# Patient Record
Sex: Female | Born: 1970 | Hispanic: Yes | Marital: Married | Smoking: Never smoker
Health system: Southern US, Community
[De-identification: ages and names within clinical notes are randomized; demographics above are authoritative.]

## PROBLEM LIST (undated history)

## (undated) DIAGNOSIS — E079 Disorder of thyroid, unspecified: Secondary | ICD-10-CM

## (undated) DIAGNOSIS — D649 Anemia, unspecified: Secondary | ICD-10-CM

## (undated) DIAGNOSIS — E785 Hyperlipidemia, unspecified: Secondary | ICD-10-CM

## (undated) HISTORY — DX: Disorder of thyroid, unspecified: E07.9

## (undated) HISTORY — DX: Hyperlipidemia, unspecified: E78.5

## (undated) HISTORY — PX: THYROIDECTOMY: SHX17

## (undated) HISTORY — DX: Anemia, unspecified: D64.9

---

## 2007-06-13 ENCOUNTER — Ambulatory Visit: Payer: Self-pay | Admitting: Internal Medicine

## 2010-02-14 ENCOUNTER — Ambulatory Visit: Payer: Self-pay

## 2012-03-02 ENCOUNTER — Ambulatory Visit: Payer: Self-pay

## 2012-03-14 ENCOUNTER — Ambulatory Visit: Payer: Self-pay

## 2012-09-15 ENCOUNTER — Ambulatory Visit: Payer: Self-pay

## 2013-03-16 ENCOUNTER — Ambulatory Visit: Payer: Self-pay

## 2013-12-19 ENCOUNTER — Ambulatory Visit: Payer: Self-pay | Admitting: Otolaryngology

## 2013-12-26 ENCOUNTER — Ambulatory Visit: Payer: Self-pay | Admitting: Otolaryngology

## 2014-01-10 ENCOUNTER — Ambulatory Visit: Payer: Self-pay

## 2014-02-26 ENCOUNTER — Ambulatory Visit: Payer: Self-pay | Admitting: Internal Medicine

## 2014-08-28 ENCOUNTER — Ambulatory Visit: Payer: Self-pay | Admitting: Internal Medicine

## 2015-05-06 ENCOUNTER — Encounter: Payer: Self-pay | Admitting: Oncology

## 2015-05-06 ENCOUNTER — Encounter: Payer: Self-pay | Admitting: *Deleted

## 2015-05-06 ENCOUNTER — Ambulatory Visit: Payer: Self-pay | Attending: Oncology | Admitting: *Deleted

## 2015-05-06 ENCOUNTER — Inpatient Hospital Stay: Payer: Self-pay

## 2015-05-06 ENCOUNTER — Inpatient Hospital Stay: Payer: Self-pay | Attending: Oncology | Admitting: Oncology

## 2015-05-06 ENCOUNTER — Ambulatory Visit
Admission: RE | Admit: 2015-05-06 | Discharge: 2015-05-06 | Disposition: A | Discharge status: Home / Self Care | Payer: Self-pay | Source: Ambulatory Visit | Attending: Oncology | Admitting: Oncology

## 2015-05-06 VITALS — BP 120/77 | HR 51 | Temp 95.6°F | Ht 64.0 in | Wt 145.0 lb

## 2015-05-06 DIAGNOSIS — Z0000 Encounter for general adult medical examination without abnormal findings: Secondary | ICD-10-CM

## 2015-05-06 DIAGNOSIS — Z85850 Personal history of malignant neoplasm of thyroid: Secondary | ICD-10-CM | POA: Insufficient documentation

## 2015-05-06 DIAGNOSIS — C73 Malignant neoplasm of thyroid gland: Secondary | ICD-10-CM

## 2015-05-06 DIAGNOSIS — D509 Iron deficiency anemia, unspecified: Secondary | ICD-10-CM

## 2015-05-06 DIAGNOSIS — E785 Hyperlipidemia, unspecified: Secondary | ICD-10-CM | POA: Insufficient documentation

## 2015-05-06 DIAGNOSIS — Z79899 Other long term (current) drug therapy: Secondary | ICD-10-CM | POA: Insufficient documentation

## 2015-05-06 LAB — CBC
HEMATOCRIT: 34.8 % — AB (ref 35.0–47.0)
HEMOGLOBIN: 11.1 g/dL — AB (ref 12.0–16.0)
MCH: 24.3 pg — ABNORMAL LOW (ref 26.0–34.0)
MCHC: 32 g/dL (ref 32.0–36.0)
MCV: 75.9 fL — ABNORMAL LOW (ref 80.0–100.0)
Platelets: 361 10*3/uL (ref 150–440)
RBC: 4.58 MIL/uL (ref 3.80–5.20)
RDW: 19.1 % — ABNORMAL HIGH (ref 11.5–14.5)
WBC: 8.1 10*3/uL (ref 3.6–11.0)

## 2015-05-06 LAB — LACTATE DEHYDROGENASE: LDH: 151 U/L (ref 98–192)

## 2015-05-06 LAB — FERRITIN: Ferritin: 8 ng/mL — ABNORMAL LOW (ref 11–307)

## 2015-05-06 LAB — IRON AND TIBC
IRON: 49 ug/dL (ref 28–170)
Saturation Ratios: 9 % — ABNORMAL LOW (ref 10.4–31.8)
TIBC: 541 ug/dL — AB (ref 250–450)
UIBC: 492 ug/dL

## 2015-05-06 LAB — DAT, POLYSPECIFIC AHG (ARMC ONLY): POLYSPECIFIC AHG TEST: NEGATIVE

## 2015-05-06 LAB — FOLATE: Folate: 17.2 ng/mL (ref >5.9)

## 2015-05-06 LAB — VITAMIN B12: Vitamin B-12: 461 pg/mL (ref 180–914)

## 2015-05-06 LAB — TSH: TSH: 0.681 u[IU]/mL (ref 0.350–4.500)

## 2015-05-06 NOTE — Progress Notes (Signed)
Patient seen by Dr. Grayland Ormond in [facility] clinic as patient had screening appointment and mammogram scheduled for today.

## 2015-05-06 NOTE — Progress Notes (Signed)
Mailed letter to patient to inform her of her normal mammogram.  To follow-up with annual screening in one year.  HSIS to [place].

## 2015-05-06 NOTE — Progress Notes (Signed)
Subjective:     Patient ID: Teresa Atkins, female   DOB: [birth year], 44 y.o.   MRN: [id number]  HPI   Review of Systems     Objective:   Physical Exam  Pulmonary/Chest: Right breast exhibits no inverted nipple, no mass, no nipple discharge, no skin change and no tenderness. Left breast exhibits no inverted nipple, no mass, no nipple discharge, no skin change and no tenderness. Breasts are symmetrical.    Bilateral thickening in the upper outer quadrants       Assessment:     44 year old Hispanic female presents to [facility] for clinical breast exam and mammogram.  Kennyth Lose, the interpreter present during the interview and exam.  Clinical breast exam unremarkable.  Taught self breast awareness.  Patient has been screened for eligibility.  She does not have any insurance, Medicare or Medicaid.  She also meets financial eligibility.  Hand-out given on the Affordable Care Act.     Plan:    Screening mammogram ordered.  Will follow-up per protocol.

## 2015-05-07 LAB — HEMOGLOBINOPATHY EVALUATION
HGB A: 97.9 % (ref 94.0–98.0)
HGB F QUANT: 0 % (ref 0.0–2.0)
HGB S QUANTITAION: 0 %
Hgb A2 Quant: 2.1 % (ref 0.7–3.1)
Hgb C: 0 %

## 2015-05-07 LAB — THYROGLOBULIN ANTIBODY: Thyroglobulin Antibody: <1 IU/mL (ref 0.0–0.9)

## 2015-05-07 LAB — ERYTHROPOIETIN: ERYTHROPOIETIN: 28.2 m[IU]/mL — AB (ref 2.6–18.5)

## 2015-05-07 LAB — HAPTOGLOBIN: Haptoglobin: 235 mg/dL — ABNORMAL HIGH (ref 34–200)

## 2015-05-07 LAB — ANA W/REFLEX: ANA: NEGATIVE

## 2015-05-07 LAB — T4: T4, Total: 10.2 ug/dL (ref 4.5–12.0)

## 2015-05-17 NOTE — Progress Notes (Signed)
[place]  Telephone:(336) [phone] Fax:(336) [phone]  ID: Lahoma Crocker OB: 08/17/1970  MR#: [id number]  QIO#:[id number]  Patient Care Team: [name], MD as PCP - General (Internal Medicine) [name], RN as Registered Nurse [name], RN as Registered Nurse  CHIEF COMPLAINT:  Chief Complaint  Patient presents with  . Anemia    INTERVAL HISTORY: Patient is a 44 year old female who was found to have a decreased hemoglobin on routine blood work. She also has a history of papillary thyroid carcinoma status post thyroidectomy. She currently feels well and is asymptomatic. She has no neurologic complaints. She denies any recent fevers. She denies any pain. She does not complain of weakness or fatigue. She denies any chest pain or shortness of breath. She denies any nausea, vomiting, constipation, or diarrhea. She has no urinary complaints. Patient feels at her baseline and offers no specific complaints today.  REVIEW OF SYSTEMS:   Review of Systems  Constitutional: Negative.   HENT: Negative.   Respiratory: Negative.   Cardiovascular: Negative.   Gastrointestinal: Negative.   Musculoskeletal: Negative.   Neurological: Negative.     As per HPI. Otherwise, a complete review of systems is negatve.  PAST MEDICAL HISTORY: Past Medical History  Diagnosis Date  . Thyroid disease   . Hyperlipidemia   . Anemia     PAST SURGICAL HISTORY: Past Surgical History  Procedure Laterality Date  . Thyroidectomy      FAMILY HISTORY No family history on file.     ADVANCED DIRECTIVES:    HEALTH MAINTENANCE: Social History  Substance Use Topics  . Smoking status: Not on file  . Smokeless tobacco: Not on file  . Alcohol Use: Not on file     Colonoscopy:  PAP:  Bone density:  Lipid panel:  Allergies  Allergen Reactions  . Penicillins Rash    Current Outpatient Prescriptions  Medication Sig Dispense Refill  . acetaminophen  (TYLENOL) 500 MG tablet Take 500 mg by mouth every 6 (six) hours as needed.    . ferrous sulfate 325 (65 FE) MG tablet Take 325 mg by mouth 2 (two) times daily with a meal.    . levothyroxine (SYNTHROID, LEVOTHROID) 112 MCG tablet Take 112 mcg by mouth daily before breakfast.    . sertraline (ZOLOFT) 50 MG tablet Take 50 mg by mouth at bedtime.     No current facility-administered medications for this visit.    OBJECTIVE: There were no vitals filed for this visit.   There is no height or weight on file to calculate BMI.    ECOG FS:0 - Asymptomatic  General: Well-developed, well-nourished, no acute distress. Eyes: Pink conjunctiva, anicteric sclera. HEENT: Well-healed surgical scar. No palpable masses or lymphadenopathy. Lungs: Clear to auscultation bilaterally. Heart: Regular rate and rhythm. No rubs, murmurs, or gallops. Abdomen: Soft, nontender, nondistended. No organomegaly noted, normoactive bowel sounds. Musculoskeletal: No edema, cyanosis, or clubbing. Neuro: Alert, answering all questions appropriately. Cranial nerves grossly intact. Skin: No rashes or petechiae noted. Psych: Normal affect.  LAB RESULTS:  No results found for: NA, K, CL, CO2, GLUCOSE, BUN, CREATININE, CALCIUM, PROT, ALBUMIN, AST, ALT, ALKPHOS, BILITOT, GFRNONAA, GFRAA  Lab Results  Component Value Date   WBC 8.1 05/06/2015   HGB 11.1* 05/06/2015   HCT 34.8* 05/06/2015   MCV 75.9* 05/06/2015   PLT 361 05/06/2015     STUDIES: Mm Digital Screening  05/06/2015   CLINICAL DATA:  Screening.  EXAM: DIGITAL SCREENING BILATERAL MAMMOGRAM WITH  CAD  COMPARISON:  Previous exam(s).  ACR Breast Density Category c: The breast tissue is heterogeneously dense, which may obscure small masses.  FINDINGS: There are no findings suspicious for malignancy. Images were processed with CAD.  IMPRESSION: No mammographic evidence of malignancy. A result letter of this screening mammogram will be mailed directly to the patient.   RECOMMENDATION: Screening mammogram in one year. (Code:SM-B-01Y)  BI-RADS CATEGORY  1: Negative.   Electronically Signed   By: Altamese Cabal M.D.   On: 05/06/2015 13:09    ASSESSMENT: Iron deficiency anemia, history of thyroid cancer.  PLAN:    1. Iron deficiency anemia: Patient's hemoglobin is only mildly decreased, but she has significantly decreased iron stores. This is likely related to her reportedly heavy menses. The remainder of her laboratory work is either negative or within normal limits. Return to clinic in 1 month for further evaluation and consideration of IV Feraheme. 2. History of thyroid cancer: Patient underwent thyroidectomy at [facility], but does not report having received radioactive iodide. Antithyroglobulin antibodies are negative. She has been instructed to keep her ENT follow-up as previously scheduled. Continue current dose of Synthroid. 3. BCCCP: Screening mammogram is BI-RADS 1 as reported above.  The entire visit was done in the presence of an interpreter.  Patient expressed understanding and was in agreement with this plan. She also understands that She can call clinic at any time with any questions, concerns, or complaints.   Lloyd Huger, MD   05/17/2015 7:14 PM

## 2015-06-05 ENCOUNTER — Other Ambulatory Visit: Payer: Self-pay | Admitting: *Deleted

## 2015-06-05 ENCOUNTER — Inpatient Hospital Stay: Payer: Self-pay

## 2015-06-05 ENCOUNTER — Inpatient Hospital Stay: Payer: Self-pay | Admitting: Oncology

## 2015-06-05 DIAGNOSIS — D649 Anemia, unspecified: Secondary | ICD-10-CM

## 2015-06-17 ENCOUNTER — Ambulatory Visit: Payer: Self-pay | Admitting: Oncology

## 2015-06-17 ENCOUNTER — Ambulatory Visit: Payer: Self-pay

## 2015-06-17 ENCOUNTER — Other Ambulatory Visit: Payer: Self-pay

## 2015-06-20 ENCOUNTER — Inpatient Hospital Stay: Payer: Self-pay | Admitting: Oncology

## 2015-06-20 ENCOUNTER — Inpatient Hospital Stay: Payer: Self-pay | Attending: Oncology

## 2015-06-20 ENCOUNTER — Inpatient Hospital Stay: Payer: Self-pay

## 2016-04-06 ENCOUNTER — Inpatient Hospital Stay: Payer: Self-pay

## 2016-04-06 ENCOUNTER — Inpatient Hospital Stay: Payer: Self-pay | Attending: Oncology | Admitting: Oncology

## 2016-04-06 ENCOUNTER — Encounter (INDEPENDENT_AMBULATORY_CARE_PROVIDER_SITE_OTHER): Payer: Self-pay

## 2016-04-06 VITALS — BP 115/74 | HR 75 | Temp 97.3°F | Resp 18 | Wt 155.2 lb

## 2016-04-06 DIAGNOSIS — D649 Anemia, unspecified: Secondary | ICD-10-CM

## 2016-04-06 DIAGNOSIS — R531 Weakness: Secondary | ICD-10-CM | POA: Insufficient documentation

## 2016-04-06 DIAGNOSIS — E890 Postprocedural hypothyroidism: Secondary | ICD-10-CM | POA: Insufficient documentation

## 2016-04-06 DIAGNOSIS — E785 Hyperlipidemia, unspecified: Secondary | ICD-10-CM | POA: Insufficient documentation

## 2016-04-06 DIAGNOSIS — R1310 Dysphagia, unspecified: Secondary | ICD-10-CM | POA: Insufficient documentation

## 2016-04-06 DIAGNOSIS — D509 Iron deficiency anemia, unspecified: Secondary | ICD-10-CM | POA: Insufficient documentation

## 2016-04-06 DIAGNOSIS — R5383 Other fatigue: Secondary | ICD-10-CM | POA: Insufficient documentation

## 2016-04-06 DIAGNOSIS — Z85850 Personal history of malignant neoplasm of thyroid: Secondary | ICD-10-CM | POA: Insufficient documentation

## 2016-04-06 DIAGNOSIS — E079 Disorder of thyroid, unspecified: Secondary | ICD-10-CM | POA: Insufficient documentation

## 2016-04-06 LAB — CBC WITH DIFFERENTIAL/PLATELET
BASOS ABS: 0.1 10*3/uL (ref 0–0.1)
Basophils Relative: 1 %
Eosinophils Absolute: 0.1 10*3/uL (ref 0–0.7)
Eosinophils Relative: 2 %
HCT: 31 % — ABNORMAL LOW (ref 35.0–47.0)
HEMOGLOBIN: 10.1 g/dL — AB (ref 12.0–16.0)
LYMPHS ABS: 2.3 10*3/uL (ref 1.0–3.6)
LYMPHS PCT: 29 %
MCH: 23.2 pg — AB (ref 26.0–34.0)
MCHC: 32.6 g/dL (ref 32.0–36.0)
MCV: 71.2 fL — AB (ref 80.0–100.0)
Monocytes Absolute: 0.6 10*3/uL (ref 0.2–0.9)
Monocytes Relative: 7 %
NEUTROS ABS: 4.7 10*3/uL (ref 1.4–6.5)
NEUTROS PCT: 61 %
PLATELETS: 413 10*3/uL (ref 150–440)
RBC: 4.35 MIL/uL (ref 3.80–5.20)
RDW: 18.5 % — ABNORMAL HIGH (ref 11.5–14.5)
WBC: 7.7 10*3/uL (ref 3.6–11.0)

## 2016-04-06 LAB — IRON AND TIBC
Iron: 10 ug/dL — ABNORMAL LOW (ref 28–170)
SATURATION RATIOS: 2 % — AB (ref 10.4–31.8)
TIBC: 469 ug/dL — AB (ref 250–450)
UIBC: 459 ug/dL

## 2016-04-06 LAB — FERRITIN: Ferritin: 4 ng/mL — ABNORMAL LOW (ref 11–307)

## 2016-04-06 NOTE — Progress Notes (Signed)
States is feeling fatigued with decreased energy levels. Referral back for anemia.

## 2016-04-06 NOTE — Progress Notes (Signed)
[place]  Telephone:(336) [phone] Fax:(336) [phone]  ID: Teresa Atkins OB: 1971-03-15  MR#: [id number]  [id number]  Patient Care Team: [name], MD as PCP - General (Internal Medicine) [name], RN as Registered Nurse [name], RN as Registered Nurse  CHIEF COMPLAINT: Iron deficiency anemia.  INTERVAL HISTORY: Patient last evaluated in clinic in October 2016. She is referred back for worsening hemoglobin and iron stores. She is also symptomatic with increased weakness and fatigue. She also noted some discomfort swallowing but no swelling or lymphadenopathy. She has no neurologic complaints. She denies any recent fevers. She denies any pain. She denies any chest pain or shortness of breath. She denies any nausea, vomiting, constipation, or diarrhea. She has no urinary complaints. Patient offers no further specific complaints today.  REVIEW OF SYSTEMS:   Review of Systems  Constitutional: Positive for malaise/fatigue. Negative for fever and weight loss.  HENT: Positive for sore throat.   Respiratory: Negative.  Negative for cough and shortness of breath.   Cardiovascular: Negative.  Negative for chest pain.  Gastrointestinal: Negative.  Negative for abdominal pain, blood in stool and melena.  Genitourinary: Negative.   Musculoskeletal: Negative.   Neurological: Positive for weakness.  Endo/Heme/Allergies: Does not bruise/bleed easily.  Psychiatric/Behavioral: Negative.  The patient is not nervous/anxious.     As per HPI. Otherwise, a complete review of systems is negative.  PAST MEDICAL HISTORY: Past Medical History:  Diagnosis Date  . Anemia   . Hyperlipidemia   . Thyroid disease     PAST SURGICAL HISTORY: Past Surgical History:  Procedure Laterality Date  . THYROIDECTOMY      FAMILY HISTORY: Reviewed and unchanged. No reported history of malignancy or chronic disease.     ADVANCED DIRECTIVES:    HEALTH  MAINTENANCE: Social History  Substance Use Topics  . Smoking status: Not on file  . Smokeless tobacco: Not on file  . Alcohol use Not on file     Colonoscopy:  PAP:  Bone density:  Lipid panel:  Allergies  Allergen Reactions  . Penicillins Rash    Current Outpatient Prescriptions  Medication Sig Dispense Refill  . levothyroxine (SYNTHROID, LEVOTHROID) 112 MCG tablet Take 112 mcg by mouth daily before breakfast.     No current facility-administered medications for this visit.     OBJECTIVE: Vitals:   04/06/16 1624  BP: 115/74  Pulse: 75  Resp: 18  Temp: 97.3 F (36.3 C)     Body mass index is 26.64 kg/m.    ECOG FS:0 - Asymptomatic  General: Well-developed, well-nourished, no acute distress. Eyes: Pink conjunctiva, anicteric sclera. HEENT: Well-healed surgical scar. No palpable masses or lymphadenopathy. Lungs: Clear to auscultation bilaterally. Heart: Regular rate and rhythm. No rubs, murmurs, or gallops. Abdomen: Soft, nontender, nondistended. No organomegaly noted, normoactive bowel sounds. Musculoskeletal: No edema, cyanosis, or clubbing. Neuro: Alert, answering all questions appropriately. Cranial nerves grossly intact. Skin: No rashes or petechiae noted. Psych: Normal affect.  LAB RESULTS:  No results found for: NA, K, CL, CO2, GLUCOSE, BUN, CREATININE, CALCIUM, PROT, ALBUMIN, AST, ALT, ALKPHOS, BILITOT, GFRNONAA, GFRAA  Lab Results  Component Value Date   WBC 7.7 04/06/2016   NEUTROABS 4.7 04/06/2016   HGB 10.1 (L) 04/06/2016   HCT 31.0 (L) 04/06/2016   MCV 71.2 (L) 04/06/2016   PLT 413 04/06/2016   Lab Results  Component Value Date   IRON 10 (L) 04/06/2016   TIBC 469 (H) 04/06/2016   IRONPCTSAT 2 (L)  04/06/2016   Lab Results  Component Value Date   FERRITIN 4 (L) 04/06/2016     STUDIES: No results found.  ASSESSMENT: Iron deficiency anemia, history of thyroid cancer.  PLAN:    1. Iron deficiency anemia: Patient's hemoglobin and  iron stores have trended down and she is symptomatic. This is likely related to her reportedly heavy menses. The remainder of her laboratory work is either negative or within normal limits. Return to clinic in 1 and 2 weeks to receive 510 mg of IV iron. Patient will then return to clinic in 3 months for repeat laboratory work and further evaluation.  2. History of thyroid cancer: Patient underwent thyroidectomy at [facility], but does not report having received radioactive iodide. Antithyroglobulin antibodies and repeat thyroid studies are pending at time of dictation. Given patient's complaint of problem swallowing, will get repeat ultrasound and neck for further evaluation. Continue current dose of Synthroid. 3. BCCCP: Screening mammogram is BI-RADS 1.  The entire visit was done in the presence of an interpreter.  Patient expressed understanding and was in agreement with this plan. She also understands that She can call clinic at any time with any questions, concerns, or complaints.   Lloyd Huger, MD   04/10/2016 5:07 PM

## 2016-04-10 ENCOUNTER — Other Ambulatory Visit: Payer: Self-pay | Admitting: Oncology

## 2016-04-14 ENCOUNTER — Inpatient Hospital Stay: Payer: Self-pay | Attending: Oncology

## 2016-04-14 DIAGNOSIS — D509 Iron deficiency anemia, unspecified: Secondary | ICD-10-CM | POA: Insufficient documentation

## 2016-04-14 DIAGNOSIS — Z79899 Other long term (current) drug therapy: Secondary | ICD-10-CM | POA: Insufficient documentation

## 2016-04-20 ENCOUNTER — Ambulatory Visit
Admission: RE | Admit: 2016-04-20 | Discharge: 2016-04-20 | Disposition: A | Discharge status: Home / Self Care | Payer: Self-pay | Source: Ambulatory Visit | Attending: Oncology | Admitting: Oncology

## 2016-04-20 DIAGNOSIS — Z9889 Other specified postprocedural states: Secondary | ICD-10-CM | POA: Insufficient documentation

## 2016-04-20 DIAGNOSIS — R1310 Dysphagia, unspecified: Secondary | ICD-10-CM | POA: Insufficient documentation

## 2016-04-21 ENCOUNTER — Inpatient Hospital Stay: Payer: Self-pay

## 2016-04-21 VITALS — BP 100/61 | HR 80 | Resp 20

## 2016-04-21 DIAGNOSIS — D509 Iron deficiency anemia, unspecified: Secondary | ICD-10-CM

## 2016-04-21 MED ORDER — SODIUM CHLORIDE 0.9 % IV SOLN
510.0000 mg | Freq: Once | INTRAVENOUS | Status: AC
  Administered 2016-04-21: 510 mg via INTRAVENOUS
  Filled 2016-04-21: qty 17

## 2016-04-21 MED ORDER — SODIUM CHLORIDE 0.9 % IV SOLN
Freq: Once | INTRAVENOUS | Status: AC
  Administered 2016-04-21: 14:00:00 via INTRAVENOUS
  Filled 2016-04-21: qty 1000

## 2016-04-28 ENCOUNTER — Inpatient Hospital Stay: Payer: Self-pay

## 2016-04-28 VITALS — BP 101/65 | HR 65 | Temp 97.6°F | Resp 18

## 2016-04-28 DIAGNOSIS — D509 Iron deficiency anemia, unspecified: Secondary | ICD-10-CM

## 2016-04-28 MED ORDER — FERUMOXYTOL INJECTION 510 MG/17 ML
510.0000 mg | Freq: Once | INTRAVENOUS | Status: AC
  Administered 2016-04-28: 510 mg via INTRAVENOUS
  Filled 2016-04-28: qty 17

## 2016-04-28 MED ORDER — SODIUM CHLORIDE 0.9 % IV SOLN
Freq: Once | INTRAVENOUS | Status: AC
  Administered 2016-04-28: 14:00:00 via INTRAVENOUS
  Filled 2016-04-28: qty 1000

## 2016-07-08 ENCOUNTER — Other Ambulatory Visit: Payer: Self-pay | Admitting: *Deleted

## 2016-07-08 DIAGNOSIS — D509 Iron deficiency anemia, unspecified: Secondary | ICD-10-CM

## 2016-07-08 NOTE — Progress Notes (Signed)
[place]  Telephone:(336) [phone] Fax:(336) [phone]  ID: Lahoma Crocker OB: 1971/02/09  MR#: [id number]  [id number]  Patient Care Team: [name], MD as PCP - General (Internal Medicine) [name], RN as Registered Nurse [name], RN as Registered Nurse  CHIEF COMPLAINT: Iron deficiency anemia.  INTERVAL HISTORY: Patient returns to clinic today for repeat laboratory work and further evaluation. She currently feels well and is asymptomatic. She does not complain of any further weakness or fatigue. She has no neurologic complaints. She denies any recent fevers. She denies any pain. She denies any chest pain or shortness of breath. She denies any nausea, vomiting, constipation, or diarrhea. She denies any melena or hematochezia.  She has no urinary complaints. Patient offers no specific complaints today.  REVIEW OF SYSTEMS:   Review of Systems  Constitutional: Negative for fever, malaise/fatigue and weight loss.  HENT: Negative for sore throat.   Respiratory: Negative.  Negative for cough and shortness of breath.   Cardiovascular: Negative.  Negative for chest pain and leg swelling.  Gastrointestinal: Negative.  Negative for abdominal pain, blood in stool and melena.  Genitourinary: Negative.   Musculoskeletal: Negative.   Neurological: Negative.  Negative for weakness.  Endo/Heme/Allergies: Does not bruise/bleed easily.  Psychiatric/Behavioral: Negative.  The patient is not nervous/anxious.     As per HPI. Otherwise, a complete review of systems is negative.  PAST MEDICAL HISTORY: Past Medical History:  Diagnosis Date  . Anemia   . Hyperlipidemia   . Thyroid disease     PAST SURGICAL HISTORY: Past Surgical History:  Procedure Laterality Date  . THYROIDECTOMY      FAMILY HISTORY: Reviewed and unchanged. No reported history of malignancy or chronic disease.     ADVANCED DIRECTIVES:    HEALTH MAINTENANCE: Social  History  Substance Use Topics  . Smoking status: Not on file  . Smokeless tobacco: Not on file  . Alcohol use Not on file     Colonoscopy:  PAP:  Bone density:  Lipid panel:  Allergies  Allergen Reactions  . Iron Other (See Comments)  . Penicillins Rash    Current Outpatient Prescriptions  Medication Sig Dispense Refill  . levothyroxine (SYNTHROID, LEVOTHROID) 112 MCG tablet Take 112 mcg by mouth daily before breakfast.     No current facility-administered medications for this visit.     OBJECTIVE: Vitals:   07/09/16 1424  BP: 111/72  Pulse: 70  Resp: 18  Temp: 99.1 F (37.3 C)     Body mass index is 27 kg/m.    ECOG FS:0 - Asymptomatic  General: Well-developed, well-nourished, no acute distress. Eyes: Pink conjunctiva, anicteric sclera. Lungs: Clear to auscultation bilaterally. Heart: Regular rate and rhythm. No rubs, murmurs, or gallops. Abdomen: Soft, nontender, nondistended. No organomegaly noted, normoactive bowel sounds. Musculoskeletal: No edema, cyanosis, or clubbing. Neuro: Alert, answering all questions appropriately. Cranial nerves grossly intact. Skin: No rashes or petechiae noted. Psych: Normal affect.  LAB RESULTS:  No results found for: NA, K, CL, CO2, GLUCOSE, BUN, CREATININE, CALCIUM, PROT, ALBUMIN, AST, ALT, ALKPHOS, BILITOT, GFRNONAA, GFRAA  Lab Results  Component Value Date   WBC 7.9 07/09/2016   NEUTROABS 5.0 07/09/2016   HGB 12.7 07/09/2016   HCT 36.8 07/09/2016   MCV 85.3 07/09/2016   PLT 334 07/09/2016   Lab Results  Component Value Date   IRON 71 07/09/2016   TIBC 350 07/09/2016   IRONPCTSAT 20 07/09/2016   Lab Results  Component Value Date  FERRITIN 30 07/09/2016     STUDIES: No results found.  ASSESSMENT: Iron deficiency anemia, history of thyroid cancer.  PLAN:    1. Iron deficiency anemia: Patient's hemoglobin and iron stores Are now within normal limits. Previously, the remainder of her laboratory work is  either negative or within normal limits. Patient last received IV Feraheme in September 2017. No intervention is needed at this time. Return to clinic in 4 months with repeat laboratory work and further evaluation.  2. History of thyroid cancer: Patient underwent thyroidectomy at [facility], but does not report having received radioactive iodide. Antithyroglobulin antibodies for May 06, 2015 are negative. Ultrasound of soft tissue neck on April 20, 2016 did not reveal any evidence of recurrence. Continue current dose of Synthroid. 3. BCCCP: Screening mammogram is BI-RADS 1.  The entire visit was done in the presence of an interpreter.  Patient expressed understanding and was in agreement with this plan. She also understands that She can call clinic at any time with any questions, concerns, or complaints.   Lloyd Huger, MD   07/13/2016 11:25 AM

## 2016-07-09 ENCOUNTER — Inpatient Hospital Stay: Payer: Self-pay

## 2016-07-09 ENCOUNTER — Inpatient Hospital Stay: Payer: Self-pay | Attending: Oncology

## 2016-07-09 ENCOUNTER — Inpatient Hospital Stay (HOSPITAL_BASED_OUTPATIENT_CLINIC_OR_DEPARTMENT_OTHER): Payer: Self-pay | Admitting: Oncology

## 2016-07-09 VITALS — BP 111/72 | HR 70 | Temp 99.1°F | Resp 18 | Wt 157.3 lb

## 2016-07-09 DIAGNOSIS — D509 Iron deficiency anemia, unspecified: Secondary | ICD-10-CM

## 2016-07-09 DIAGNOSIS — Z85850 Personal history of malignant neoplasm of thyroid: Secondary | ICD-10-CM | POA: Insufficient documentation

## 2016-07-09 DIAGNOSIS — E785 Hyperlipidemia, unspecified: Secondary | ICD-10-CM | POA: Insufficient documentation

## 2016-07-09 DIAGNOSIS — E890 Postprocedural hypothyroidism: Secondary | ICD-10-CM

## 2016-07-09 DIAGNOSIS — Z79899 Other long term (current) drug therapy: Secondary | ICD-10-CM

## 2016-07-09 LAB — IRON AND TIBC
IRON: 71 ug/dL (ref 28–170)
Saturation Ratios: 20 % (ref 10.4–31.8)
TIBC: 350 ug/dL (ref 250–450)
UIBC: 279 ug/dL

## 2016-07-09 LAB — CBC WITH DIFFERENTIAL/PLATELET
BASOS ABS: 0.1 10*3/uL (ref 0–0.1)
BASOS PCT: 1 %
EOS ABS: 0.1 10*3/uL (ref 0–0.7)
Eosinophils Relative: 2 %
HEMATOCRIT: 36.8 % (ref 35.0–47.0)
Hemoglobin: 12.7 g/dL (ref 12.0–16.0)
Lymphocytes Relative: 29 %
Lymphs Abs: 2.3 10*3/uL (ref 1.0–3.6)
MCH: 29.4 pg (ref 26.0–34.0)
MCHC: 34.4 g/dL (ref 32.0–36.0)
MCV: 85.3 fL (ref 80.0–100.0)
MONO ABS: 0.5 10*3/uL (ref 0.2–0.9)
Monocytes Relative: 6 %
NEUTROS ABS: 5 10*3/uL (ref 1.4–6.5)
Neutrophils Relative %: 62 %
PLATELETS: 334 10*3/uL (ref 150–440)
RBC: 4.31 MIL/uL (ref 3.80–5.20)
RDW: 19.8 % — AB (ref 11.5–14.5)
WBC: 7.9 10*3/uL (ref 3.6–11.0)

## 2016-07-09 LAB — FERRITIN: FERRITIN: 30 ng/mL (ref 11–307)

## 2016-07-09 NOTE — Progress Notes (Signed)
Offers no complaints  

## 2016-11-03 NOTE — Progress Notes (Deleted)
[place]  Telephone:(336) [phone] Fax:(336) [phone]  ID: Teresa Atkins OB: January 17, 1971  MR#: [id number]  KNL#:[id number]  Patient Care Team: [name], MD as PCP - General (Internal Medicine) [name], RN as Registered Nurse [name], RN as Registered Nurse  CHIEF COMPLAINT: Iron deficiency anemia.  INTERVAL HISTORY: Patient returns to clinic today for repeat laboratory work and further evaluation. She currently feels well and is asymptomatic. She does not complain of any further weakness or fatigue. She has no neurologic complaints. She denies any recent fevers. She denies any pain. She denies any chest pain or shortness of breath. She denies any nausea, vomiting, constipation, or diarrhea. She denies any melena or hematochezia.  She has no urinary complaints. Patient offers no specific complaints today.  REVIEW OF SYSTEMS:   Review of Systems  Constitutional: Negative for fever, malaise/fatigue and weight loss.  HENT: Negative for sore throat.   Respiratory: Negative.  Negative for cough and shortness of breath.   Cardiovascular: Negative.  Negative for chest pain and leg swelling.  Gastrointestinal: Negative.  Negative for abdominal pain, blood in stool and melena.  Genitourinary: Negative.   Musculoskeletal: Negative.   Neurological: Negative.  Negative for weakness.  Endo/Heme/Allergies: Does not bruise/bleed easily.  Psychiatric/Behavioral: Negative.  The patient is not nervous/anxious.     As per HPI. Otherwise, a complete review of systems is negative.  PAST MEDICAL HISTORY: Past Medical History:  Diagnosis Date  . Anemia   . Hyperlipidemia   . Thyroid disease     PAST SURGICAL HISTORY: Past Surgical History:  Procedure Laterality Date  . THYROIDECTOMY      FAMILY HISTORY: Reviewed and unchanged. No reported history of malignancy or chronic disease.     ADVANCED DIRECTIVES:    HEALTH MAINTENANCE: Social  History  Substance Use Topics  . Smoking status: Not on file  . Smokeless tobacco: Not on file  . Alcohol use Not on file     Colonoscopy:  PAP:  Bone density:  Lipid panel:  Allergies  Allergen Reactions  . Iron Other (See Comments)  . Penicillins Rash    Current Outpatient Prescriptions  Medication Sig Dispense Refill  . levothyroxine (SYNTHROID, LEVOTHROID) 112 MCG tablet Take 112 mcg by mouth daily before breakfast.     No current facility-administered medications for this visit.     OBJECTIVE: There were no vitals filed for this visit.   There is no height or weight on file to calculate BMI.    ECOG FS:0 - Asymptomatic  General: Well-developed, well-nourished, no acute distress. Eyes: Pink conjunctiva, anicteric sclera. Lungs: Clear to auscultation bilaterally. Heart: Regular rate and rhythm. No rubs, murmurs, or gallops. Abdomen: Soft, nontender, nondistended. No organomegaly noted, normoactive bowel sounds. Musculoskeletal: No edema, cyanosis, or clubbing. Neuro: Alert, answering all questions appropriately. Cranial nerves grossly intact. Skin: No rashes or petechiae noted. Psych: Normal affect.  LAB RESULTS:  No results found for: NA, K, CL, CO2, GLUCOSE, BUN, CREATININE, CALCIUM, PROT, ALBUMIN, AST, ALT, ALKPHOS, BILITOT, GFRNONAA, GFRAA  Lab Results  Component Value Date   WBC 7.9 07/09/2016   NEUTROABS 5.0 07/09/2016   HGB 12.7 07/09/2016   HCT 36.8 07/09/2016   MCV 85.3 07/09/2016   PLT 334 07/09/2016   Lab Results  Component Value Date   IRON 71 07/09/2016   TIBC 350 07/09/2016   IRONPCTSAT 20 07/09/2016   Lab Results  Component Value Date   FERRITIN 30 07/09/2016  STUDIES: No results found.  ASSESSMENT: Iron deficiency anemia, history of thyroid cancer.  PLAN:    1. Iron deficiency anemia: Patient's hemoglobin and iron stores Are now within normal limits. Previously, the remainder of her laboratory work is either negative or  within normal limits. Patient last received IV Feraheme in September 2017. No intervention is needed at this time. Return to clinic in 4 months with repeat laboratory work and further evaluation.  2. History of thyroid cancer: Patient underwent thyroidectomy at [facility], but does not report having received radioactive iodide. Antithyroglobulin antibodies for May 06, 2015 are negative. Ultrasound of soft tissue neck on April 20, 2016 did not reveal any evidence of recurrence. Continue current dose of Synthroid. 3. BCCCP: Screening mammogram is BI-RADS 1.  The entire visit was done in the presence of an interpreter.  Patient expressed understanding and was in agreement with this plan. She also understands that She can call clinic at any time with any questions, concerns, or complaints.   Lloyd Huger, MD   11/03/2016 11:22 PM

## 2016-11-04 ENCOUNTER — Other Ambulatory Visit: Payer: Self-pay | Admitting: Oncology

## 2016-11-05 ENCOUNTER — Inpatient Hospital Stay: Payer: Self-pay | Attending: Oncology

## 2016-11-05 ENCOUNTER — Inpatient Hospital Stay: Payer: Self-pay | Admitting: Oncology

## 2016-11-05 ENCOUNTER — Inpatient Hospital Stay: Payer: Self-pay

## 2017-03-17 ENCOUNTER — Other Ambulatory Visit: Payer: Self-pay | Admitting: Internal Medicine

## 2017-03-25 IMAGING — US US SOFT TISSUE HEAD/NECK
1 series · 14 of 25 positions shown · non-contrast
Comparison: Prior thyroid ultrasound 12/19/2013 ;

CLINICAL DATA: 45-year-old female with a history of thyroid cancer
status post thyroidectomy in 6900. New onset dysphagia.

EXAM:
THYROID ULTRASOUND
TECHNIQUE: Ultrasound examination of the thyroid gland and adjacent soft
tissues was performed.

[Series 1: us soft tissue head/neck · 0.06mm/px · 14 of 28 slices shown]
[im 1/28]
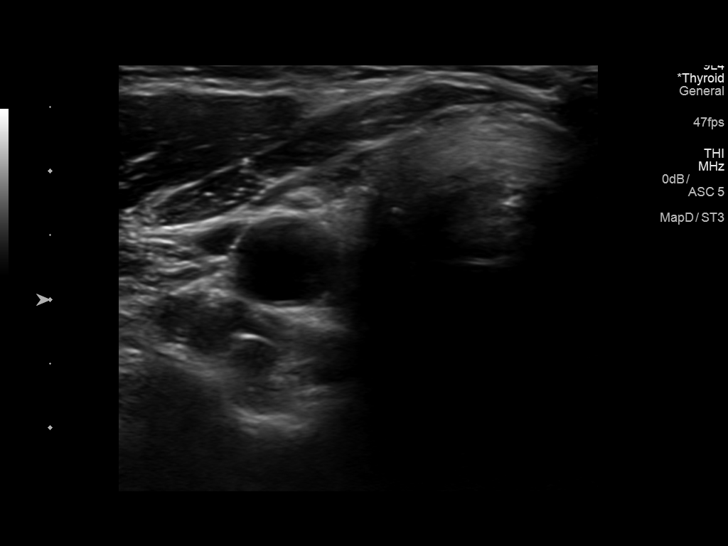
[im 3/28]
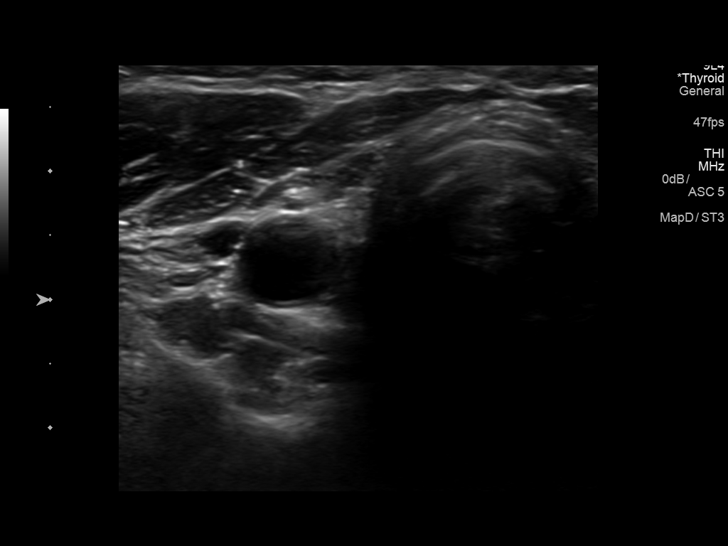
[im 5/28]
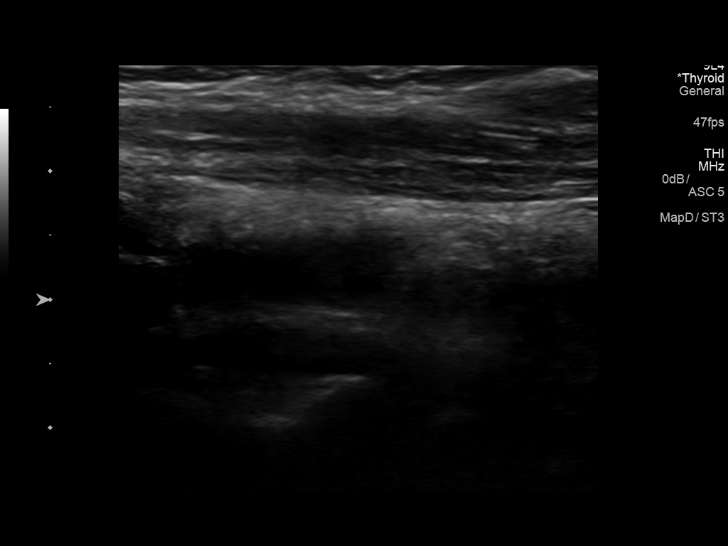
[im 7/28]
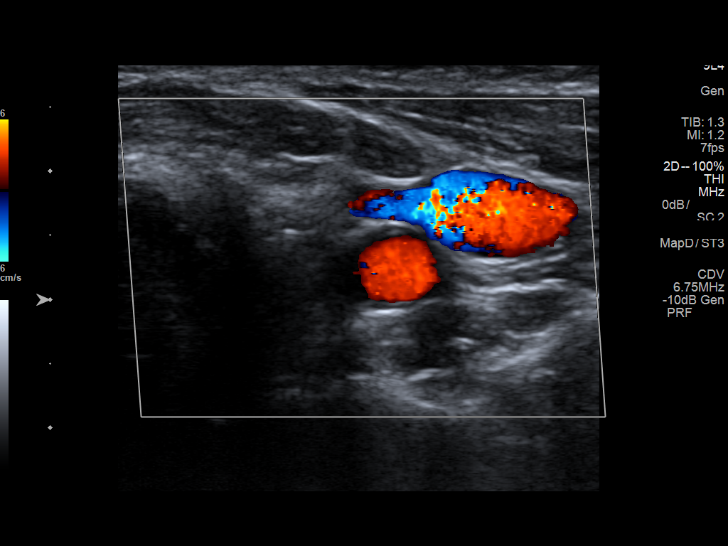
[im 10/28]
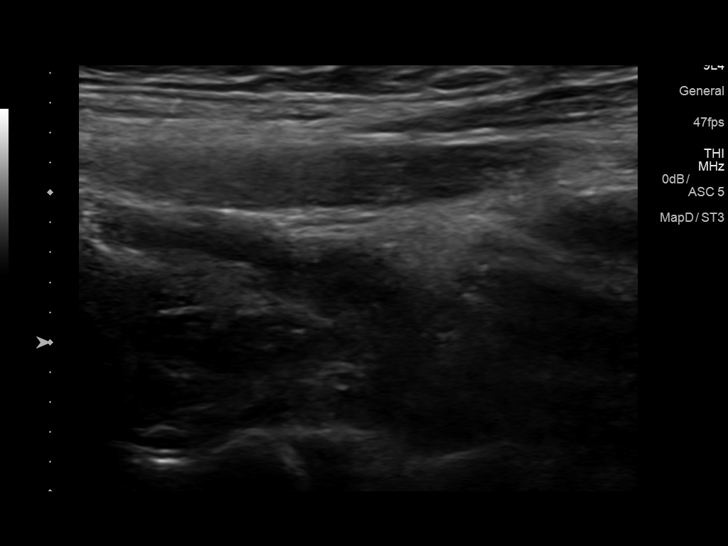
[im 11/28]
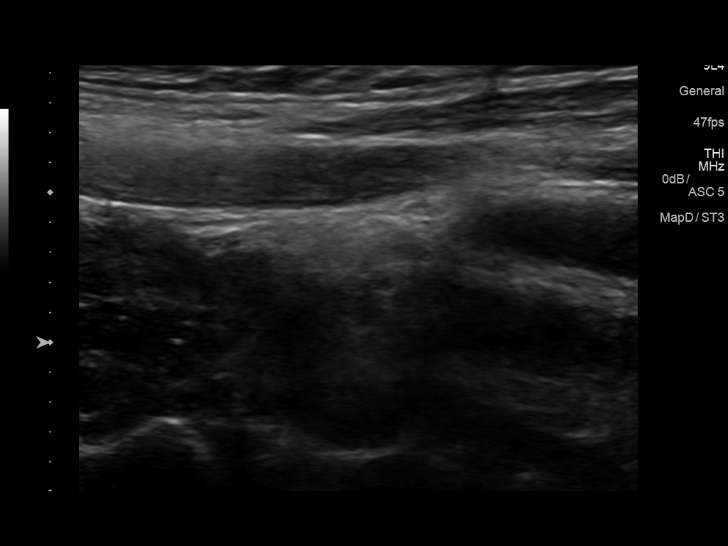
[im 13/28]
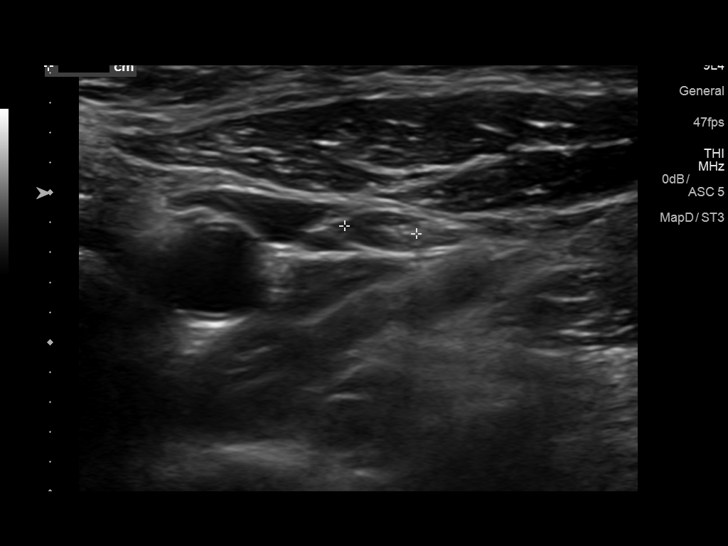
[im 15/28]
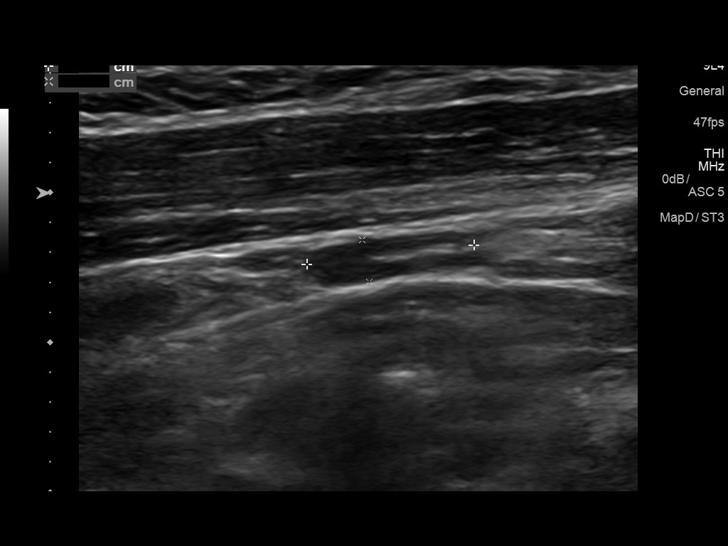
[im 17/28]
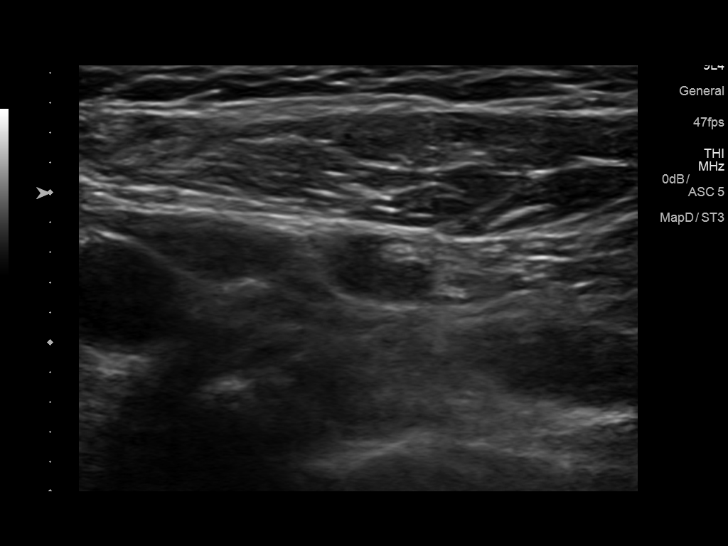
[im 19/28]
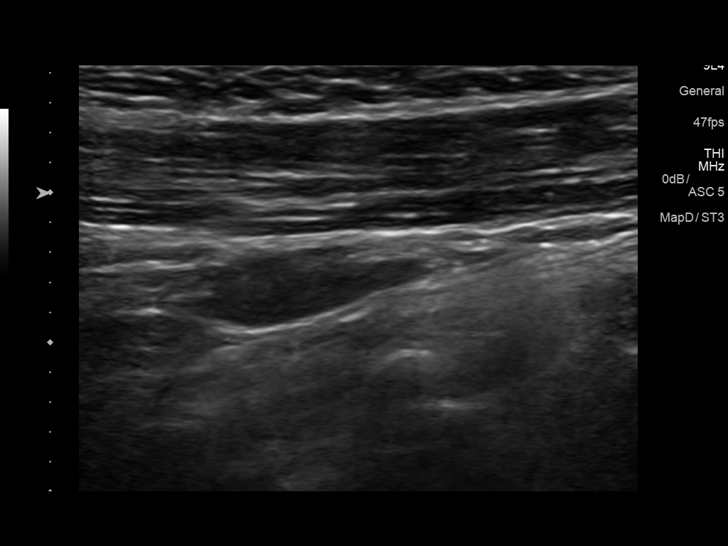
[im 21/28]
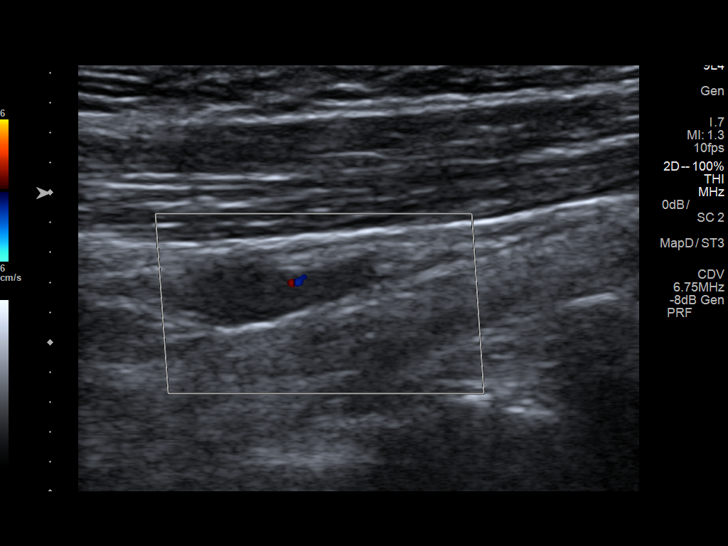
[im 23/28]
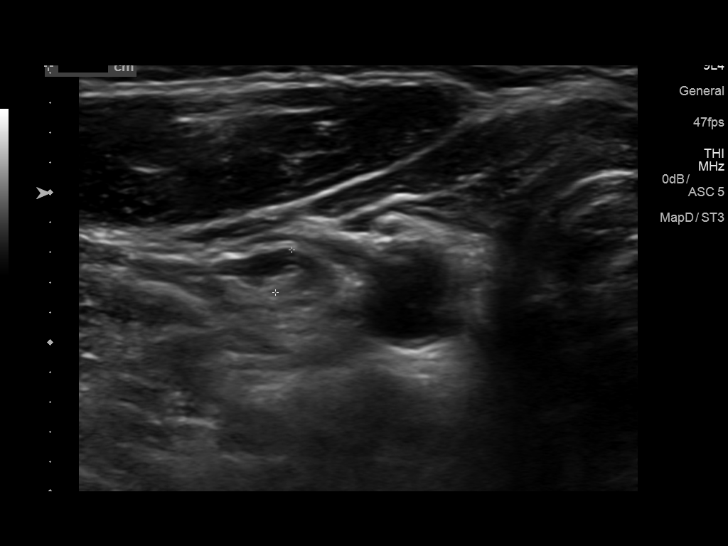
[im 25/28]
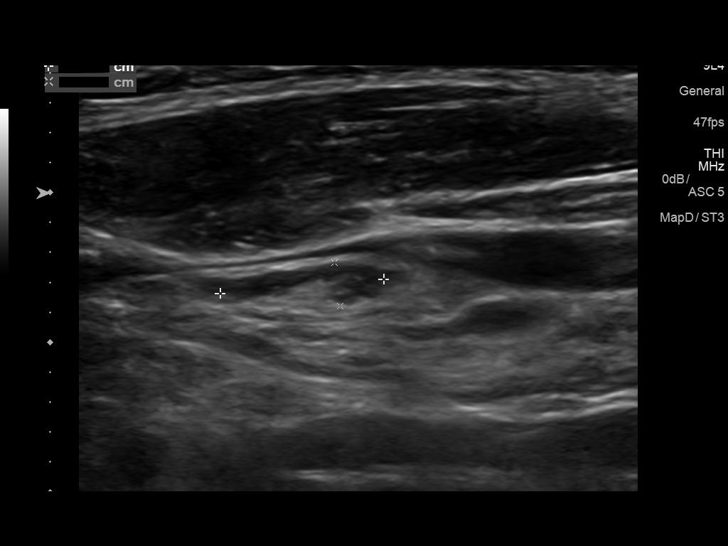
[im 28/28]
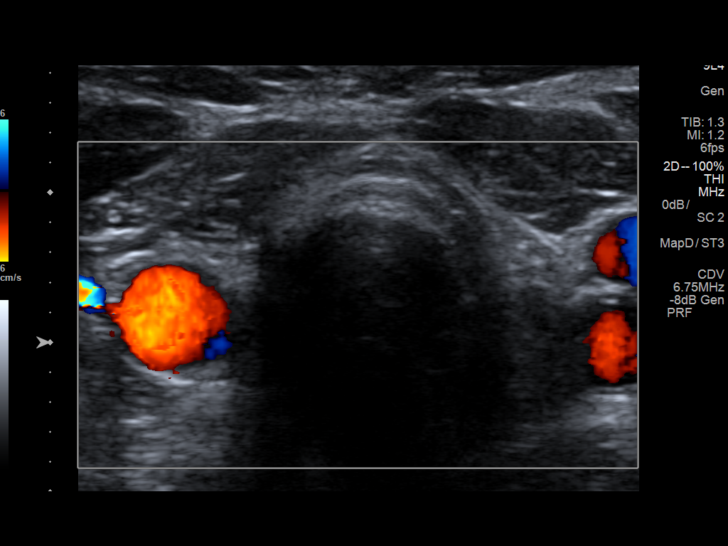

[14 of 25 positions shown; findings below may reference images not displayed]

prior
ultrasound-guided thyroid biopsy 12/26/2013; prior CT scan of the
neck 08/28/2014
FINDINGS: Right thyroid lobe

Surgically absent. No evidence of residual or recurrent thyroid
tissue.

Left thyroid lobe

Surgically absent. No compelling evidence of residual or recurrent
thyroid tissue.

Lymphadenopathy

No suspicious adenopathy. Normally sized lymph nodes with normal
morphology are noted bilaterally.
IMPRESSION: Surgical changes of prior thyroidectomy without evidence of
recurrence.

## 2017-04-04 NOTE — Progress Notes (Signed)
[place]  [phone] [phone]  ID: Lahoma Crocker OB: 12-13-1970  MR#: [id number]  EYC#:[id number]  Patient Care Team: [name], MD as PCP - General (Internal Medicine) [name], RN as Registered Nurse [name], RN as Registered Nurse  CHIEF COMPLAINT: Iron deficiency anemia.  INTERVAL HISTORY: Patient returns to clinic today for repeat laboratory work and further evaluation. She continues to feel well and is asymptomatic. She does not complain of any further weakness or fatigue. She has no neurologic complaints. She denies any recent fevers. She denies any pain. She denies any chest pain or shortness of breath. She denies any nausea, vomiting, constipation, or diarrhea. She denies any melena or hematochezia.  She has no urinary complaints. Patient offers no specific complaints today.  REVIEW OF SYSTEMS:   Review of Systems  Constitutional: Negative for fever, malaise/fatigue and weight loss.  HENT: Negative for sore throat.   Respiratory: Negative.  Negative for cough and shortness of breath.   Cardiovascular: Negative.  Negative for chest pain and leg swelling.  Gastrointestinal: Negative.  Negative for abdominal pain, blood in stool and melena.  Genitourinary: Negative.  Negative for hematuria.  Musculoskeletal: Negative.   Skin: Negative.  Negative for rash.  Neurological: Negative.  Negative for weakness.  Endo/Heme/Allergies: Does not bruise/bleed easily.  Psychiatric/Behavioral: Negative.  The patient is not nervous/anxious.     As per HPI. Otherwise, a complete review of systems is negative.  PAST MEDICAL HISTORY: Past Medical History:  Diagnosis Date  . Anemia   . Hyperlipidemia   . Thyroid disease     PAST SURGICAL HISTORY: Past Surgical History:  Procedure Laterality Date  . THYROIDECTOMY      FAMILY HISTORY: Reviewed and unchanged. No reported history of malignancy or chronic  disease.     ADVANCED DIRECTIVES:    HEALTH MAINTENANCE: Social History  Substance Use Topics  . Smoking status: Not on file  . Smokeless tobacco: Not on file  . Alcohol use Not on file     Colonoscopy:  PAP:  Bone density:  Lipid panel:  Allergies  Allergen Reactions  . Penicillins Rash    Current Outpatient Prescriptions  Medication Sig Dispense Refill  . levothyroxine (SYNTHROID, LEVOTHROID) 112 MCG tablet Take 112 mcg by mouth daily before breakfast.     No current facility-administered medications for this visit.     OBJECTIVE: Vitals:   04/07/17 1422  BP: 114/72  Pulse: 71  Resp: 18  Temp: 98 F (36.7 C)     Body mass index is 26.66 kg/m.    ECOG FS:0 - Asymptomatic  General: Well-developed, well-nourished, no acute distress. Eyes: Pink conjunctiva, anicteric sclera. Lungs: Clear to auscultation bilaterally. Heart: Regular rate and rhythm. No rubs, murmurs, or gallops. Abdomen: Soft, nontender, nondistended. No organomegaly noted, normoactive bowel sounds. Musculoskeletal: No edema, cyanosis, or clubbing. Neuro: Alert, answering all questions appropriately. Cranial nerves grossly intact. Skin: No rashes or petechiae noted. Psych: Normal affect.  LAB RESULTS:  No results found for: NA, K, CL, CO2, GLUCOSE, BUN, CREATININE, CALCIUM, PROT, ALBUMIN, AST, ALT, ALKPHOS, BILITOT, GFRNONAA, GFRAA  Lab Results  Component Value Date   WBC 8.2 04/07/2017   NEUTROABS 5.4 04/07/2017   HGB 12.5 04/07/2017   HCT 36.4 04/07/2017   MCV 84.1 04/07/2017   PLT 400 04/07/2017   Lab Results  Component Value Date   IRON 64 04/07/2017   TIBC 454 (H) 04/07/2017   IRONPCTSAT 14 04/07/2017  Lab Results  Component Value Date   FERRITIN 4 (L) 04/07/2017     STUDIES: No results found.  ASSESSMENT: Iron deficiency anemia, history of thyroid cancer.  PLAN:    1. Iron deficiency anemia: Patient's hemoglobin Continues to be within normal limits. She has a  mildly decreased ferritin, but otherwise her iron panel is also within normal limits. Previously, the remainder of her laboratory work is either negative or within normal limits. Patient last received IV Feraheme in September 2017. No intervention is needed at this time. She has been instructed to continue her oral iron supplementation. After discussion with the patient, no further follow-up has been scheduled. Please refer her back if there are any questions or concerns.  2. History of thyroid cancer: Patient underwent thyroidectomy at [facility], but does not report having received radioactive iodide. Antithyroglobulin antibodies for May 06, 2015 are negative. Ultrasound of soft tissue neck on April 20, 2016 did not reveal any evidence of recurrence. Continue current dose of Synthroid.   The entire visit was done in the presence of an interpreter.  Patient expressed understanding and was in agreement with this plan. She also understands that She can call clinic at any time with any questions, concerns, or complaints.   Lloyd Huger, MD   04/08/2017 3:24 PM

## 2017-04-07 ENCOUNTER — Inpatient Hospital Stay: Payer: Self-pay | Attending: Oncology

## 2017-04-07 ENCOUNTER — Inpatient Hospital Stay (HOSPITAL_BASED_OUTPATIENT_CLINIC_OR_DEPARTMENT_OTHER): Payer: Self-pay | Admitting: Oncology

## 2017-04-07 ENCOUNTER — Other Ambulatory Visit: Payer: Self-pay

## 2017-04-07 VITALS — BP 114/72 | HR 71 | Temp 98.0°F | Resp 18 | Wt 155.3 lb

## 2017-04-07 DIAGNOSIS — E785 Hyperlipidemia, unspecified: Secondary | ICD-10-CM

## 2017-04-07 DIAGNOSIS — D509 Iron deficiency anemia, unspecified: Secondary | ICD-10-CM | POA: Insufficient documentation

## 2017-04-07 DIAGNOSIS — Z85850 Personal history of malignant neoplasm of thyroid: Secondary | ICD-10-CM

## 2017-04-07 DIAGNOSIS — Z79899 Other long term (current) drug therapy: Secondary | ICD-10-CM

## 2017-04-07 DIAGNOSIS — E890 Postprocedural hypothyroidism: Secondary | ICD-10-CM

## 2017-04-07 LAB — FERRITIN: Ferritin: 4 ng/mL — ABNORMAL LOW (ref 11–307)

## 2017-04-07 LAB — CBC WITH DIFFERENTIAL/PLATELET
BASOS ABS: 0.1 10*3/uL (ref 0–0.1)
Basophils Relative: 1 %
EOS ABS: 0.1 10*3/uL (ref 0–0.7)
EOS PCT: 1 %
HEMATOCRIT: 36.4 % (ref 35.0–47.0)
Hemoglobin: 12.5 g/dL (ref 12.0–16.0)
Lymphocytes Relative: 27 %
Lymphs Abs: 2.2 10*3/uL (ref 1.0–3.6)
MCH: 28.9 pg (ref 26.0–34.0)
MCHC: 34.4 g/dL (ref 32.0–36.0)
MCV: 84.1 fL (ref 80.0–100.0)
MONO ABS: 0.5 10*3/uL (ref 0.2–0.9)
MONOS PCT: 6 %
Neutro Abs: 5.4 10*3/uL (ref 1.4–6.5)
Neutrophils Relative %: 65 %
PLATELETS: 400 10*3/uL (ref 150–440)
RBC: 4.33 MIL/uL (ref 3.80–5.20)
RDW: 13.9 % (ref 11.5–14.5)
WBC: 8.2 10*3/uL (ref 3.6–11.0)

## 2017-04-07 LAB — IRON AND TIBC
Iron: 64 ug/dL (ref 28–170)
SATURATION RATIOS: 14 % (ref 10.4–31.8)
TIBC: 454 ug/dL — AB (ref 250–450)
UIBC: 390 ug/dL

## 2017-04-14 ENCOUNTER — Ambulatory Visit: Payer: Self-pay | Attending: Oncology | Admitting: *Deleted

## 2017-04-14 ENCOUNTER — Encounter: Payer: Self-pay | Admitting: *Deleted

## 2017-04-14 VITALS — BP 116/78 | HR 76 | Temp 97.0°F | Ht 64.0 in | Wt 158.0 lb

## 2017-04-14 DIAGNOSIS — N6459 Other signs and symptoms in breast: Secondary | ICD-10-CM

## 2017-04-14 DIAGNOSIS — Z0000 Encounter for general adult medical examination without abnormal findings: Secondary | ICD-10-CM

## 2017-04-14 NOTE — Patient Instructions (Signed)
Gave patient hand-out, Women Staying Healthy, Active and Well from [facility], with education on breast health, pap smears, heart and colon health. 

## 2017-04-14 NOTE — Progress Notes (Signed)
Subjective:     Patient ID: Teresa Atkins, female   DOB: 1971/05/07, 46 y.o.   MRN: [id number]  HPI   Review of Systems     Objective:   Physical Exam  Pulmonary/Chest: Right breast exhibits no inverted nipple, no mass, no nipple discharge, no skin change and no tenderness. Left breast exhibits mass. Left breast exhibits no inverted nipple, no nipple discharge, no skin change and no tenderness. Breasts are symmetrical.         Assessment:     46 year old Hispanic female presents to [facility] for annual screening.  Jacqui, the interpreter present during the interview and exam.  Clinical breast exam reveals an asymmetrical thickening at 12:00 left breast.  Taught self breast awareness.  Last pap in 2016 was completed at [facility] per patient.  Patient has been screened for eligibility.  She does not have any insurance, Medicare or Medicaid.  She also meets financial eligibility.  Hand-out given on the Affordable Care Act.    Plan:     Bilateral diagnostic mammogram and ultrasound ordered.  Jeanella Anton to schedule patients mammogram.  Will follow-up per [facility] protocol.

## 2017-04-29 ENCOUNTER — Ambulatory Visit
Admission: RE | Admit: 2017-04-29 | Discharge: 2017-04-29 | Disposition: A | Discharge status: Home / Self Care | Payer: Self-pay | Source: Ambulatory Visit | Attending: Oncology | Admitting: Oncology

## 2017-04-29 DIAGNOSIS — N6459 Other signs and symptoms in breast: Secondary | ICD-10-CM

## 2017-04-29 DIAGNOSIS — Z0000 Encounter for general adult medical examination without abnormal findings: Secondary | ICD-10-CM

## 2017-05-03 ENCOUNTER — Other Ambulatory Visit: Payer: Self-pay | Admitting: *Deleted

## 2017-05-03 DIAGNOSIS — N63 Unspecified lump in unspecified breast: Secondary | ICD-10-CM

## 2017-05-10 ENCOUNTER — Ambulatory Visit
Admission: RE | Admit: 2017-05-10 | Discharge: 2017-05-10 | Disposition: A | Discharge status: Home / Self Care | Payer: Self-pay | Source: Ambulatory Visit | Attending: Oncology | Admitting: Oncology

## 2017-05-10 ENCOUNTER — Other Ambulatory Visit: Payer: Self-pay | Admitting: *Deleted

## 2017-05-10 DIAGNOSIS — N63 Unspecified lump in unspecified breast: Secondary | ICD-10-CM

## 2017-05-10 DIAGNOSIS — N6320 Unspecified lump in the left breast, unspecified quadrant: Secondary | ICD-10-CM | POA: Insufficient documentation

## 2017-05-10 DIAGNOSIS — N6022 Fibroadenosis of left breast: Secondary | ICD-10-CM | POA: Insufficient documentation

## 2017-05-10 HISTORY — PX: BREAST BIOPSY: SHX20

## 2017-05-11 LAB — SURGICAL PATHOLOGY

## 2017-05-13 ENCOUNTER — Encounter: Payer: Self-pay | Admitting: *Deleted

## 2017-05-13 NOTE — Progress Notes (Signed)
Patient informed of her benign biopsy by the radiologist.  She is to return in one year for annual screening.  HSIS to [place].

## 2017-08-30 NOTE — Progress Notes (Signed)
[place]  [phone] [phone]  ID: Teresa Atkins OB: [birth year]  MR#: [id number]  FYB#:[id number]  Patient Care Team: [name], MD as PCP - General (Internal Medicine) [name], RN as Registered Nurse [name], RN as Registered Nurse  CHIEF COMPLAINT: Iron deficiency anemia.  INTERVAL HISTORY: Patient was last evaluated in clinic in August 2018.  She is referred back after routine blood work revealed worsening anemia and decreased iron stores.  She admits to worsening weakness and fatigue over the past several months, but otherwise has felt well. She has no neurologic complaints. She denies any recent fevers. She denies any pain. She denies any chest pain or shortness of breath. She denies any nausea, vomiting, constipation, or diarrhea. She denies any melena or hematochezia.  She has no urinary complaints. Patient offers no further specific complaints today.  REVIEW OF SYSTEMS:   Review of Systems  Constitutional: Positive for malaise/fatigue. Negative for fever and weight loss.  HENT: Negative for sore throat.   Respiratory: Negative.  Negative for cough and shortness of breath.   Cardiovascular: Negative.  Negative for chest pain and leg swelling.  Gastrointestinal: Negative.  Negative for abdominal pain, blood in stool and melena.  Genitourinary: Negative.  Negative for hematuria.  Musculoskeletal: Negative.   Skin: Negative.  Negative for rash.  Neurological: Positive for weakness.  Endo/Heme/Allergies: Does not bruise/bleed easily.  Psychiatric/Behavioral: Negative.  The patient is not nervous/anxious.     As per HPI. Otherwise, a complete review of systems is negative.  PAST MEDICAL HISTORY: Past Medical History:  Diagnosis Date  . Anemia   . Hyperlipidemia   . Thyroid disease     PAST SURGICAL HISTORY: Past Surgical History:  Procedure Laterality Date  . BREAST BIOPSY Left 05/10/2017   2  areas biopsied under [place]. Path pending  . THYROIDECTOMY      FAMILY HISTORY: Reviewed and unchanged. No reported history of malignancy or chronic disease.     ADVANCED DIRECTIVES:    HEALTH MAINTENANCE: Social History   Tobacco Use  . Smoking status: Not on file  Substance Use Topics  . Alcohol use: Not on file  . Drug use: Not on file     Colonoscopy:  PAP:  Bone density:  Lipid panel:  Allergies  Allergen Reactions  . Penicillins Rash    Current Outpatient Medications  Medication Sig Dispense Refill  . levothyroxine (SYNTHROID, LEVOTHROID) 112 MCG tablet Take 112 mcg by mouth daily before breakfast.     No current facility-administered medications for this visit.     OBJECTIVE: Vitals:   09/01/17 1158  BP: 101/65  Pulse: 72  Resp: 18  Temp: (!) 97.2 F (36.2 C)     Body mass index is 27.53 kg/m.    ECOG FS:0 - Asymptomatic  General: Well-developed, well-nourished, no acute distress. Eyes: Pink conjunctiva, anicteric sclera. Lungs: Clear to auscultation bilaterally. Heart: Regular rate and rhythm. No rubs, murmurs, or gallops. Abdomen: Soft, nontender, nondistended. No organomegaly noted, normoactive bowel sounds. Musculoskeletal: No edema, cyanosis, or clubbing. Neuro: Alert, answering all questions appropriately. Cranial nerves grossly intact. Skin: No rashes or petechiae noted. Psych: Normal affect.  LAB RESULTS:  No results found for: NA, K, CL, CO2, GLUCOSE, BUN, CREATININE, CALCIUM, PROT, ALBUMIN, AST, ALT, ALKPHOS, BILITOT, GFRNONAA, GFRAA  Lab Results  Component Value Date   WBC 8.2 04/07/2017   NEUTROABS 5.4 04/07/2017   HGB 12.5 04/07/2017   HCT 36.4 04/07/2017  MCV 84.1 04/07/2017   PLT 400 04/07/2017   Lab Results  Component Value Date   IRON 64 04/07/2017   TIBC 454 (H) 04/07/2017   IRONPCTSAT 14 04/07/2017   Lab Results  Component Value Date   FERRITIN 4 (L) 04/07/2017     STUDIES: No results found.  ASSESSMENT: Iron  deficiency anemia, history of thyroid cancer.  PLAN:    1. Iron deficiency anemia: Patient's most recent hemoglobin at outside office was reported at 10.4.  Her saturations are 7%, ferritin is 3.  She is also mildly symptomatic.  Previously, the remainder of her laboratory work was either negative or within normal limits. Patient last received IV Feraheme in September 2017.  Return to clinic in 1 and 2 weeks for 510 mg of IV Feraheme.  Patient will then return to clinic in 3 months for repeat laboratory work and further evaluation. She has been instructed to continue her oral iron supplementation.  2. History of thyroid cancer: Patient underwent thyroidectomy at [facility], but does not report having received radioactive iodide. Antithyroglobulin antibodies for May 06, 2015 are negative. Ultrasound of soft tissue neck on April 20, 2016 did not reveal any evidence of recurrence. Continue current dose of Synthroid.  Approximately 30 minutes was spent in discussion of which greater than 50% was consultation.   The entire visit was done in the presence of an interpreter.  Patient expressed understanding and was in agreement with this plan. She also understands that She can call clinic at any time with any questions, concerns, or complaints.   Lloyd Huger, MD   09/01/2017 12:42 PM

## 2017-09-01 ENCOUNTER — Encounter: Payer: Self-pay | Admitting: Oncology

## 2017-09-01 ENCOUNTER — Inpatient Hospital Stay: Payer: Self-pay | Attending: Oncology | Admitting: Oncology

## 2017-09-01 ENCOUNTER — Other Ambulatory Visit: Payer: Self-pay

## 2017-09-01 VITALS — BP 101/65 | HR 72 | Temp 97.2°F | Resp 18 | Wt 160.4 lb

## 2017-09-01 DIAGNOSIS — R531 Weakness: Secondary | ICD-10-CM | POA: Insufficient documentation

## 2017-09-01 DIAGNOSIS — Z85850 Personal history of malignant neoplasm of thyroid: Secondary | ICD-10-CM | POA: Insufficient documentation

## 2017-09-01 DIAGNOSIS — R5381 Other malaise: Secondary | ICD-10-CM | POA: Insufficient documentation

## 2017-09-01 DIAGNOSIS — D509 Iron deficiency anemia, unspecified: Secondary | ICD-10-CM | POA: Insufficient documentation

## 2017-09-01 NOTE — Progress Notes (Signed)
Patient referred back by GI, denies concerns today.

## 2017-09-08 ENCOUNTER — Inpatient Hospital Stay: Payer: Self-pay

## 2017-09-08 VITALS — BP 101/66 | HR 76 | Resp 20

## 2017-09-08 DIAGNOSIS — D509 Iron deficiency anemia, unspecified: Secondary | ICD-10-CM

## 2017-09-08 MED ORDER — SODIUM CHLORIDE 0.9 % IV SOLN
Freq: Once | INTRAVENOUS | Status: AC
  Administered 2017-09-08: 15:00:00 via INTRAVENOUS
  Filled 2017-09-08: qty 1000

## 2017-09-08 MED ORDER — SODIUM CHLORIDE 0.9 % IV SOLN
510.0000 mg | Freq: Once | INTRAVENOUS | Status: AC
  Administered 2017-09-08: 510 mg via INTRAVENOUS
  Filled 2017-09-08: qty 17

## 2017-09-15 ENCOUNTER — Inpatient Hospital Stay: Payer: Self-pay | Attending: Oncology

## 2017-09-15 VITALS — BP 90/60 | HR 68 | Temp 97.6°F | Resp 18

## 2017-09-15 DIAGNOSIS — D509 Iron deficiency anemia, unspecified: Secondary | ICD-10-CM | POA: Insufficient documentation

## 2017-09-15 MED ORDER — SODIUM CHLORIDE 0.9 % IV SOLN
Freq: Once | INTRAVENOUS | Status: AC
  Administered 2017-09-15: 14:00:00 via INTRAVENOUS
  Filled 2017-09-15: qty 1000

## 2017-09-15 MED ORDER — SODIUM CHLORIDE 0.9 % IV SOLN
510.0000 mg | Freq: Once | INTRAVENOUS | Status: AC
  Administered 2017-09-15: 510 mg via INTRAVENOUS
  Filled 2017-09-15: qty 17

## 2017-09-22 ENCOUNTER — Other Ambulatory Visit: Payer: Self-pay

## 2017-09-22 ENCOUNTER — Ambulatory Visit: Payer: Self-pay | Admitting: Oncology

## 2017-09-22 ENCOUNTER — Ambulatory Visit: Payer: Self-pay

## 2017-11-28 NOTE — Progress Notes (Signed)
[place]  [phone] [phone]  ID: Teresa Atkins OB: August 26, 1970  MR#: [id number]  LNL#:[id number]  Patient Care Team: [name], MD as PCP - General (Internal Medicine) [name], RN as Registered Nurse [name], RN as Registered Nurse  CHIEF COMPLAINT: Iron deficiency anemia.  INTERVAL HISTORY: Patient returns to clinic today for repeat laboratory work and further evaluation.  She continues to complain of chronic weakness and fatigue, but otherwise feels well. She has no neurologic complaints.  She denies any recent fevers or illnesses.  She denies any pain. She denies any chest pain or shortness of breath. She denies any nausea, vomiting, constipation, or diarrhea. She denies any melena or hematochezia.  She has no urinary complaints.  Patient offers no further specific complaints today.  REVIEW OF SYSTEMS:   Review of Systems  Constitutional: Positive for malaise/fatigue. Negative for fever and weight loss.  HENT: Negative.  Negative for sore throat.   Respiratory: Negative.  Negative for cough and shortness of breath.   Cardiovascular: Negative.  Negative for chest pain and leg swelling.  Gastrointestinal: Negative.  Negative for abdominal pain, blood in stool and melena.  Genitourinary: Negative.  Negative for hematuria.  Musculoskeletal: Negative.   Skin: Negative.  Negative for rash.  Neurological: Positive for weakness. Negative for sensory change and focal weakness.  Endo/Heme/Allergies: Does not bruise/bleed easily.  Psychiatric/Behavioral: Negative.  The patient is not nervous/anxious.     As per HPI. Otherwise, a complete review of systems is negative.  PAST MEDICAL HISTORY: Past Medical History:  Diagnosis Date  . Anemia   . Hyperlipidemia   . Thyroid disease     PAST SURGICAL HISTORY: Past Surgical History:  Procedure Laterality Date  . BREAST BIOPSY Left 05/10/2017   2 areas  biopsied under [place]. Path pending  . THYROIDECTOMY      FAMILY HISTORY: Reviewed and unchanged. No reported history of malignancy or chronic disease.     ADVANCED DIRECTIVES:    HEALTH MAINTENANCE: Social History   Tobacco Use  . Smoking status: Not on file  Substance Use Topics  . Alcohol use: Not on file  . Drug use: Not on file     Colonoscopy:  PAP:  Bone density:  Lipid panel:  Allergies  Allergen Reactions  . Penicillins Rash and Other (See Comments)    Patient reports receiving IM penicillin at 107 months old, and subsequently becoming unconscious. Unknown rxn. From childhood   . Iron Other (See Comments)    With IV Infusions     Current Outpatient Medications  Medication Sig Dispense Refill  . levothyroxine (SYNTHROID, LEVOTHROID) 112 MCG tablet Take 112 mcg by mouth daily before breakfast.    . acetaminophen (TYLENOL) 325 MG tablet Take 650 mg by mouth every 4 (four) hours as needed.     No current facility-administered medications for this visit.     OBJECTIVE: Vitals:   11/30/17 1056  BP: 113/73  Pulse: 70  Resp: 18  Temp: (!) 96.8 F (36 C)     Body mass index is 27.65 kg/m.    ECOG FS:0 - Asymptomatic  General: Well-developed, well-nourished, no acute distress. Eyes: Pink conjunctiva, anicteric sclera. Lungs: Clear to auscultation bilaterally. Heart: Regular rate and rhythm. No rubs, murmurs, or gallops. Abdomen: Soft, nontender, nondistended. No organomegaly noted, normoactive bowel sounds. Musculoskeletal: No edema, cyanosis, or clubbing. Neuro: Alert, answering all questions appropriately. Cranial nerves grossly intact. Skin: No rashes or petechiae noted. Psych:  Normal affect.   LAB RESULTS:  No results found for: NA, K, CL, CO2, GLUCOSE, BUN, CREATININE, CALCIUM, PROT, ALBUMIN, AST, ALT, ALKPHOS, BILITOT, GFRNONAA, GFRAA  Lab Results  Component Value Date   WBC 6.6 11/30/2017   NEUTROABS 4.0 11/30/2017   HGB 13.0 11/30/2017   HCT  37.4 11/30/2017   MCV 87.6 11/30/2017   PLT 319 11/30/2017   Lab Results  Component Value Date   IRON 85 11/30/2017   TIBC 335 11/30/2017   IRONPCTSAT 25 11/30/2017   Lab Results  Component Value Date   FERRITIN 77 11/30/2017     STUDIES: No results found.  ASSESSMENT: Iron deficiency anemia, history of thyroid cancer.  PLAN:    1. Iron deficiency anemia: Patient's hemoglobin and iron stores are now within normal limits. Previously, the remainder of her laboratory work was either negative or within normal limits.  She does not require additional treatment today.  Last received 510 mg IV Feraheme on September 15, 2017.  Return to clinic in 3 months with repeat laboratory work and further evaluation.  She has been instructed to continue her oral iron supplementation.  2. History of thyroid cancer: Patient underwent thyroidectomy at [facility], but does not report having received radioactive iodide. Antithyroglobulin antibodies from November 30, 2017 are undetectable. Ultrasound of soft tissue neck on April 20, 2016 did not reveal any evidence of recurrence. Continue current dose of Synthroid. 3.  Weakness and fatigue: Unrelated to iron deficiency.  Patient's TSH and T4 are within normal limits.  She has been instructed to follow-up with her primary care physician for further evaluation.  Approximately 20 minutes was spent in discussion of which greater than 50% was consultation.  The entire visit was done in the presence of an interpreter.  Patient expressed understanding and was in agreement with this plan. She also understands that She can call clinic at any time with any questions, concerns, or complaints.   Lloyd Huger, MD   12/03/2017 11:39 AM

## 2017-11-30 ENCOUNTER — Inpatient Hospital Stay: Payer: Self-pay

## 2017-11-30 ENCOUNTER — Inpatient Hospital Stay (HOSPITAL_BASED_OUTPATIENT_CLINIC_OR_DEPARTMENT_OTHER): Payer: Self-pay | Admitting: Oncology

## 2017-11-30 ENCOUNTER — Other Ambulatory Visit: Payer: Self-pay

## 2017-11-30 ENCOUNTER — Inpatient Hospital Stay: Payer: Self-pay | Attending: Oncology

## 2017-11-30 VITALS — BP 113/73 | HR 70 | Temp 96.8°F | Resp 18 | Wt 161.1 lb

## 2017-11-30 DIAGNOSIS — R531 Weakness: Secondary | ICD-10-CM | POA: Insufficient documentation

## 2017-11-30 DIAGNOSIS — E079 Disorder of thyroid, unspecified: Secondary | ICD-10-CM

## 2017-11-30 DIAGNOSIS — R5383 Other fatigue: Secondary | ICD-10-CM

## 2017-11-30 DIAGNOSIS — D509 Iron deficiency anemia, unspecified: Secondary | ICD-10-CM | POA: Insufficient documentation

## 2017-11-30 DIAGNOSIS — Z85850 Personal history of malignant neoplasm of thyroid: Secondary | ICD-10-CM

## 2017-11-30 LAB — CBC WITH DIFFERENTIAL/PLATELET
Basophils Absolute: 0.1 10*3/uL (ref 0–0.1)
Basophils Relative: 1 %
Eosinophils Absolute: 0.1 10*3/uL (ref 0–0.7)
Eosinophils Relative: 2 %
HCT: 37.4 % (ref 35.0–47.0)
HEMOGLOBIN: 13 g/dL (ref 12.0–16.0)
LYMPHS PCT: 30 %
Lymphs Abs: 2 10*3/uL (ref 1.0–3.6)
MCH: 30.4 pg (ref 26.0–34.0)
MCHC: 34.6 g/dL (ref 32.0–36.0)
MCV: 87.6 fL (ref 80.0–100.0)
MONO ABS: 0.4 10*3/uL (ref 0.2–0.9)
MONOS PCT: 7 %
NEUTROS ABS: 4 10*3/uL (ref 1.4–6.5)
Neutrophils Relative %: 60 %
PLATELETS: 319 10*3/uL (ref 150–440)
RBC: 4.27 MIL/uL (ref 3.80–5.20)
RDW: 16.8 % — ABNORMAL HIGH (ref 11.5–14.5)
WBC: 6.6 10*3/uL (ref 3.6–11.0)

## 2017-11-30 LAB — IRON AND TIBC
Iron: 85 ug/dL (ref 28–170)
Saturation Ratios: 25 % (ref 10.4–31.8)
TIBC: 335 ug/dL (ref 250–450)
UIBC: 250 ug/dL

## 2017-11-30 LAB — FERRITIN: FERRITIN: 77 ng/mL (ref 11–307)

## 2017-11-30 LAB — TSH: TSH: 3.719 u[IU]/mL (ref 0.350–4.500)

## 2017-11-30 NOTE — Progress Notes (Signed)
Here for follow up w translator Lordes -pt stated energy level is slow -stated wants to stay in bed but pushes herself to get up.

## 2017-12-01 LAB — THYROGLOBULIN ANTIBODY

## 2017-12-01 LAB — T4: T4, Total: 7.1 ug/dL (ref 4.5–12.0)

## 2017-12-02 ENCOUNTER — Encounter: Payer: Self-pay | Admitting: Pharmacy Technician

## 2017-12-02 NOTE — Progress Notes (Signed)
Patient has been approved for drug assistance by [employer] for Feraheme. The enrollment period is from 12/02/2017-12/03/2018 based on no insurance. First DOS covered is 09/08/2017 and 09/15/2017, used indigent drug.

## 2018-02-20 NOTE — Progress Notes (Signed)
[place]  [phone] [phone]  ID: Teresa Atkins OB: July 03, 1971  MR#: [id number]  GUY#:[id number]  Patient Care Team: [name], MD as PCP - General (Internal Medicine) [name], RN as Registered Nurse [name], RN as Registered Nurse  CHIEF COMPLAINT: Iron deficiency anemia.  INTERVAL HISTORY: Patient returns to clinic today for repeat laboratory work and further evaluation.  She continues to complain of chronic weakness and fatigue that is unchanged.  She otherwise feels well and is asymptomatic.  She has had a cough and congestion for the past week but states this is improving.  She has no neurologic complaints.  She denies any recent fevers or illnesses.  She has no chest pain or shortness of breath.  She denies any nausea, vomiting, constipation, or diarrhea. She denies any melena or hematochezia.  She has no urinary complaints.  Patient otherwise feels well and offers no further specific complaints.  REVIEW OF SYSTEMS:   Review of Systems  Constitutional: Positive for malaise/fatigue. Negative for fever and weight loss.  HENT: Positive for congestion. Negative for sore throat.   Respiratory: Positive for cough. Negative for shortness of breath.   Cardiovascular: Negative.  Negative for chest pain and leg swelling.  Gastrointestinal: Negative.  Negative for abdominal pain, blood in stool and melena.  Genitourinary: Negative.  Negative for hematuria.  Musculoskeletal: Negative.  Negative for myalgias.  Skin: Negative.  Negative for rash.  Neurological: Positive for weakness. Negative for sensory change and focal weakness.  Endo/Heme/Allergies: Does not bruise/bleed easily.  Psychiatric/Behavioral: Negative.  The patient is not nervous/anxious.     As per HPI. Otherwise, a complete review of systems is negative.  PAST MEDICAL HISTORY: Past Medical History:  Diagnosis Date  . Anemia   . Hyperlipidemia    . Thyroid disease     PAST SURGICAL HISTORY: Past Surgical History:  Procedure Laterality Date  . BREAST BIOPSY Left 05/10/2017   2 areas biopsied under [place]. Path pending  . THYROIDECTOMY      FAMILY HISTORY: Reviewed and unchanged. No reported history of malignancy or chronic disease.     ADVANCED DIRECTIVES:    HEALTH MAINTENANCE: Social History   Tobacco Use  . Smoking status: Not on file  Substance Use Topics  . Alcohol use: Not on file  . Drug use: Not on file     Colonoscopy:  PAP:  Bone density:  Lipid panel:  Allergies  Allergen Reactions  . Penicillins Rash and Other (See Comments)    Patient reports receiving IM penicillin at 66 months old, and subsequently becoming unconscious. Unknown rxn. From childhood   . Iron Other (See Comments)    With IV Infusions     Current Outpatient Medications  Medication Sig Dispense Refill  . acetaminophen (TYLENOL) 325 MG tablet Take 650 mg by mouth every 4 (four) hours as needed.    Marland Kitchen levothyroxine (SYNTHROID, LEVOTHROID) 112 MCG tablet Take 112 mcg by mouth daily before breakfast.     No current facility-administered medications for this visit.     OBJECTIVE: Vitals:   02/22/18 1129  BP: 120/77  Pulse: 61  Resp: 16  Temp: 98.5 F (36.9 C)     Body mass index is 28.86 kg/m.    ECOG FS:0 - Asymptomatic  General: Well-developed, well-nourished, no acute distress. Eyes: Pink conjunctiva, anicteric sclera. HEENT: Normocephalic, moist mucous membranes, clear oropharnyx. Lungs: Clear to auscultation bilaterally. Heart: Regular rate and rhythm. No rubs, murmurs, or  gallops. Abdomen: Soft, nontender, nondistended. No organomegaly noted, normoactive bowel sounds. Musculoskeletal: No edema, cyanosis, or clubbing. Neuro: Alert, answering all questions appropriately. Cranial nerves grossly intact. Skin: No rashes or petechiae noted. Psych: Normal affect.  LAB RESULTS:  No results found for: NA, K, CL, CO2,  GLUCOSE, BUN, CREATININE, CALCIUM, PROT, ALBUMIN, AST, ALT, ALKPHOS, BILITOT, GFRNONAA, GFRAA  Lab Results  Component Value Date   WBC 6.1 02/22/2018   NEUTROABS 3.5 02/22/2018   HGB 12.9 02/22/2018   HCT 37.2 02/22/2018   MCV 88.5 02/22/2018   PLT 334 02/22/2018   Lab Results  Component Value Date   IRON 59 02/22/2018   TIBC 314 02/22/2018   IRONPCTSAT 19 02/22/2018   Lab Results  Component Value Date   FERRITIN 52 02/22/2018     STUDIES: No results found.  ASSESSMENT: Iron deficiency anemia, history of thyroid cancer.  PLAN:    1. Iron deficiency anemia: Patient's hemoglobin and iron stores continue to be within normal limits. Previously, the remainder of her laboratory work was either negative or within normal limits.  She does not require additional treatment today.  Last received 510 mg IV Feraheme on September 15, 2017.  Continue oral iron supplementation.  Return to clinic in 4 months with repeat laboratory work and further evaluation.   2. History of thyroid cancer: Patient underwent thyroidectomy at [facility], but does not report having received radioactive iodide. Antithyroglobulin antibodies from November 30, 2017 are undetectable. Ultrasound of soft tissue neck on April 20, 2016 did not reveal any evidence of recurrence.  Continue current dose of Synthroid as prescribed by primary care.   3.  Weakness and fatigue: Unrelated to iron deficiency.   I spent a total of 20 minutes face-to-face with the patient of which greater than 50% of the visit was spent in counseling and coordination of care as detailed above.   The entire visit was done in the presence of an interpreter.  Patient expressed understanding and was in agreement with this plan. She also understands that She can call clinic at any time with any questions, concerns, or complaints.   Lloyd Huger, MD   02/25/2018 4:33 PM

## 2018-02-22 ENCOUNTER — Inpatient Hospital Stay (HOSPITAL_BASED_OUTPATIENT_CLINIC_OR_DEPARTMENT_OTHER): Payer: Self-pay | Admitting: Oncology

## 2018-02-22 ENCOUNTER — Other Ambulatory Visit: Payer: Self-pay

## 2018-02-22 ENCOUNTER — Inpatient Hospital Stay: Payer: Self-pay | Attending: Oncology

## 2018-02-22 ENCOUNTER — Inpatient Hospital Stay: Payer: Self-pay

## 2018-02-22 VITALS — BP 120/77 | HR 61 | Temp 98.5°F | Resp 16 | Wt 168.1 lb

## 2018-02-22 DIAGNOSIS — Z85850 Personal history of malignant neoplasm of thyroid: Secondary | ICD-10-CM | POA: Insufficient documentation

## 2018-02-22 DIAGNOSIS — R5382 Chronic fatigue, unspecified: Secondary | ICD-10-CM | POA: Insufficient documentation

## 2018-02-22 DIAGNOSIS — D509 Iron deficiency anemia, unspecified: Secondary | ICD-10-CM

## 2018-02-22 DIAGNOSIS — R531 Weakness: Secondary | ICD-10-CM

## 2018-02-22 LAB — CBC WITH DIFFERENTIAL/PLATELET
BASOS ABS: 0 10*3/uL (ref 0–0.1)
Basophils Relative: 1 %
EOS PCT: 2 %
Eosinophils Absolute: 0.1 10*3/uL (ref 0–0.7)
HEMATOCRIT: 37.2 % (ref 35.0–47.0)
Hemoglobin: 12.9 g/dL (ref 12.0–16.0)
LYMPHS ABS: 2 10*3/uL (ref 1.0–3.6)
LYMPHS PCT: 33 %
MCH: 30.6 pg (ref 26.0–34.0)
MCHC: 34.6 g/dL (ref 32.0–36.0)
MCV: 88.5 fL (ref 80.0–100.0)
MONO ABS: 0.4 10*3/uL (ref 0.2–0.9)
Monocytes Relative: 7 %
NEUTROS ABS: 3.5 10*3/uL (ref 1.4–6.5)
Neutrophils Relative %: 57 %
PLATELETS: 334 10*3/uL (ref 150–440)
RBC: 4.21 MIL/uL (ref 3.80–5.20)
RDW: 12.5 % (ref 11.5–14.5)
WBC: 6.1 10*3/uL (ref 3.6–11.0)

## 2018-02-22 LAB — IRON AND TIBC
Iron: 59 ug/dL (ref 28–170)
Saturation Ratios: 19 % (ref 10.4–31.8)
TIBC: 314 ug/dL (ref 250–450)
UIBC: 255 ug/dL

## 2018-02-22 LAB — FERRITIN: Ferritin: 52 ng/mL (ref 11–307)

## 2018-02-22 NOTE — Progress Notes (Signed)
Pt has interpreter today, pt reports still very fatigued and improvement.  Pt also reports having a cough and chest congestion for 1 week.

## 2018-06-19 NOTE — Progress Notes (Deleted)
[place]  Telephone:([phone] [phone]  ID: Teresa Atkins OB: [birth year]  MR#: [id number]  GYI#:[id number]  Patient Care Team: [name], MD as PCP - General (Internal Medicine) [name], RN as Registered Nurse [name], RN as Registered Nurse  CHIEF COMPLAINT: Iron deficiency anemia.  INTERVAL HISTORY: Patient returns to clinic today for repeat laboratory work and further evaluation.  She continues to complain of chronic weakness and fatigue that is unchanged.  She otherwise feels well and is asymptomatic.  She has had a cough and congestion for the past week but states this is improving.  She has no neurologic complaints.  She denies any recent fevers or illnesses.  She has no chest pain or shortness of breath.  She denies any nausea, vomiting, constipation, or diarrhea. She denies any melena or hematochezia.  She has no urinary complaints.  Patient otherwise feels well and offers no further specific complaints.  REVIEW OF SYSTEMS:   Review of Systems  Constitutional: Positive for malaise/fatigue. Negative for fever and weight loss.  HENT: Positive for congestion. Negative for sore throat.   Respiratory: Positive for cough. Negative for shortness of breath.   Cardiovascular: Negative.  Negative for chest pain and leg swelling.  Gastrointestinal: Negative.  Negative for abdominal pain, blood in stool and melena.  Genitourinary: Negative.  Negative for hematuria.  Musculoskeletal: Negative.  Negative for myalgias.  Skin: Negative.  Negative for rash.  Neurological: Positive for weakness. Negative for sensory change and focal weakness.  Endo/Heme/Allergies: Does not bruise/bleed easily.  Psychiatric/Behavioral: Negative.  The patient is not nervous/anxious.     As per HPI. Otherwise, a complete review of systems is negative.  PAST MEDICAL HISTORY: Past Medical History:  Diagnosis Date  . Anemia   . Hyperlipidemia    . Thyroid disease     PAST SURGICAL HISTORY: Past Surgical History:  Procedure Laterality Date  . BREAST BIOPSY Left 05/10/2017   2 areas biopsied under [place]. Path pending  . THYROIDECTOMY      FAMILY HISTORY: Reviewed and unchanged. No reported history of malignancy or chronic disease.     ADVANCED DIRECTIVES:    HEALTH MAINTENANCE: Social History   Tobacco Use  . Smoking status: Not on file  Substance Use Topics  . Alcohol use: Not on file  . Drug use: Not on file     Colonoscopy:  PAP:  Bone density:  Lipid panel:  Allergies  Allergen Reactions  . Penicillins Rash and Other (See Comments)    Patient reports receiving IM penicillin at 23 months old, and subsequently becoming unconscious. Unknown rxn. From childhood   . Iron Other (See Comments)    With IV Infusions     Current Outpatient Medications  Medication Sig Dispense Refill  . acetaminophen (TYLENOL) 325 MG tablet Take 650 mg by mouth every 4 (four) hours as needed.    Marland Kitchen levothyroxine (SYNTHROID, LEVOTHROID) 112 MCG tablet Take 112 mcg by mouth daily before breakfast.     No current facility-administered medications for this visit.     OBJECTIVE: There were no vitals filed for this visit.   There is no height or weight on file to calculate BMI.    ECOG FS:0 - Asymptomatic  General: Well-developed, well-nourished, no acute distress. Eyes: Pink conjunctiva, anicteric sclera. HEENT: Normocephalic, moist mucous membranes, clear oropharnyx. Lungs: Clear to auscultation bilaterally. Heart: Regular rate and rhythm. No rubs, murmurs, or gallops. Abdomen: Soft, nontender, nondistended. No organomegaly noted, normoactive  bowel sounds. Musculoskeletal: No edema, cyanosis, or clubbing. Neuro: Alert, answering all questions appropriately. Cranial nerves grossly intact. Skin: No rashes or petechiae noted. Psych: Normal affect.  LAB RESULTS:  No results found for: NA, K, CL, CO2, GLUCOSE, BUN, CREATININE,  CALCIUM, PROT, ALBUMIN, AST, ALT, ALKPHOS, BILITOT, GFRNONAA, GFRAA  Lab Results  Component Value Date   WBC 6.1 02/22/2018   NEUTROABS 3.5 02/22/2018   HGB 12.9 02/22/2018   HCT 37.2 02/22/2018   MCV 88.5 02/22/2018   PLT 334 02/22/2018   Lab Results  Component Value Date   IRON 59 02/22/2018   TIBC 314 02/22/2018   IRONPCTSAT 19 02/22/2018   Lab Results  Component Value Date   FERRITIN 52 02/22/2018     STUDIES: No results found.  ASSESSMENT: Iron deficiency anemia, history of thyroid cancer.  PLAN:    1. Iron deficiency anemia: Patient's hemoglobin and iron stores continue to be within normal limits. Previously, the remainder of her laboratory work was either negative or within normal limits.  She does not require additional treatment today.  Last received 510 mg IV Feraheme on September 15, 2017.  Continue oral iron supplementation.  Return to clinic in 4 months with repeat laboratory work and further evaluation.   2. History of thyroid cancer: Patient underwent thyroidectomy at [facility], but does not report having received radioactive iodide. Antithyroglobulin antibodies from November 30, 2017 are undetectable. Ultrasound of soft tissue neck on April 20, 2016 did not reveal any evidence of recurrence.  Continue current dose of Synthroid as prescribed by primary care.   3.  Weakness and fatigue: Unrelated to iron deficiency.   I spent a total of 20 minutes face-to-face with the patient of which greater than 50% of the visit was spent in counseling and coordination of care as detailed above.   The entire visit was done in the presence of an interpreter.  Patient expressed understanding and was in agreement with this plan. She also understands that She can call clinic at any time with any questions, concerns, or complaints.   Lloyd Huger, MD   06/19/2018 12:47 PM

## 2018-06-21 ENCOUNTER — Other Ambulatory Visit: Payer: Self-pay

## 2018-06-21 ENCOUNTER — Ambulatory Visit: Payer: Self-pay

## 2018-06-21 ENCOUNTER — Inpatient Hospital Stay: Payer: Self-pay | Admitting: Oncology

## 2018-06-21 ENCOUNTER — Inpatient Hospital Stay: Payer: Self-pay

## 2018-06-21 ENCOUNTER — Inpatient Hospital Stay: Payer: Self-pay | Attending: Oncology

## 2018-06-21 ENCOUNTER — Ambulatory Visit: Payer: Self-pay | Admitting: Oncology

## 2018-06-21 DIAGNOSIS — D509 Iron deficiency anemia, unspecified: Secondary | ICD-10-CM

## 2018-06-21 LAB — CBC WITH DIFFERENTIAL/PLATELET
Abs Immature Granulocytes: 0.01 10*3/uL (ref 0.00–0.07)
BASOS ABS: 0.1 10*3/uL (ref 0.0–0.1)
Basophils Relative: 1 %
EOS ABS: 0.1 10*3/uL (ref 0.0–0.5)
EOS PCT: 1 %
HCT: 39.6 % (ref 36.0–46.0)
HEMOGLOBIN: 12.8 g/dL (ref 12.0–15.0)
Immature Granulocytes: 0 %
LYMPHS PCT: 32 %
Lymphs Abs: 2.4 10*3/uL (ref 0.7–4.0)
MCH: 28.7 pg (ref 26.0–34.0)
MCHC: 32.3 g/dL (ref 30.0–36.0)
MCV: 88.8 fL (ref 80.0–100.0)
MONO ABS: 0.4 10*3/uL (ref 0.1–1.0)
Monocytes Relative: 5 %
NRBC: 0 % (ref 0.0–0.2)
Neutro Abs: 4.6 10*3/uL (ref 1.7–7.7)
Neutrophils Relative %: 61 %
Platelets: 343 10*3/uL (ref 150–400)
RBC: 4.46 MIL/uL (ref 3.87–5.11)
RDW: 12.5 % (ref 11.5–15.5)
WBC: 7.6 10*3/uL (ref 4.0–10.5)

## 2018-06-21 LAB — IRON AND TIBC
Iron: 123 ug/dL (ref 28–170)
SATURATION RATIOS: 31 % (ref 10.4–31.8)
TIBC: 391 ug/dL (ref 250–450)
UIBC: 268 ug/dL

## 2018-06-21 LAB — FERRITIN: FERRITIN: 12 ng/mL (ref 11–307)

## 2018-06-22 ENCOUNTER — Other Ambulatory Visit: Payer: Self-pay | Admitting: *Deleted

## 2018-06-22 DIAGNOSIS — D509 Iron deficiency anemia, unspecified: Secondary | ICD-10-CM

## 2018-06-30 ENCOUNTER — Ambulatory Visit: Payer: Self-pay

## 2018-06-30 ENCOUNTER — Ambulatory Visit: Payer: Self-pay | Admitting: Oncology

## 2018-12-20 ENCOUNTER — Ambulatory Visit: Payer: Self-pay | Admitting: Oncology

## 2018-12-20 ENCOUNTER — Ambulatory Visit: Payer: Self-pay

## 2018-12-20 ENCOUNTER — Other Ambulatory Visit: Payer: Self-pay

## 2019-03-18 NOTE — Progress Notes (Deleted)
[place]  [phone] [phone]  ID: Teresa Atkins OB: 15-Jul-1971  MR#: [id number]  GQQ#:[id number]  Patient Care Team: [name], MD as PCP - General (Internal Medicine) [name], RN as Registered Nurse [name], RN as Registered Nurse  CHIEF COMPLAINT: Iron deficiency anemia.  INTERVAL HISTORY: Patient returns to clinic today for repeat laboratory work and further evaluation.  She continues to complain of chronic weakness and fatigue that is unchanged.  She otherwise feels well and is asymptomatic.  She has had a cough and congestion for the past week but states this is improving.  She has no neurologic complaints.  She denies any recent fevers or illnesses.  She has no chest pain or shortness of breath.  She denies any nausea, vomiting, constipation, or diarrhea. She denies any melena or hematochezia.  She has no urinary complaints.  Patient otherwise feels well and offers no further specific complaints.  REVIEW OF SYSTEMS:   Review of Systems  Constitutional: Positive for malaise/fatigue. Negative for fever and weight loss.  HENT: Positive for congestion. Negative for sore throat.   Respiratory: Positive for cough. Negative for shortness of breath.   Cardiovascular: Negative.  Negative for chest pain and leg swelling.  Gastrointestinal: Negative.  Negative for abdominal pain, blood in stool and melena.  Genitourinary: Negative.  Negative for hematuria.  Musculoskeletal: Negative.  Negative for myalgias.  Skin: Negative.  Negative for rash.  Neurological: Positive for weakness. Negative for sensory change and focal weakness.  Endo/Heme/Allergies: Does not bruise/bleed easily.  Psychiatric/Behavioral: Negative.  The patient is not nervous/anxious.     As per HPI. Otherwise, a complete review of systems is negative.  PAST MEDICAL HISTORY: Past Medical History:  Diagnosis Date  . Anemia   . Hyperlipidemia    . Thyroid disease     PAST SURGICAL HISTORY: Past Surgical History:  Procedure Laterality Date  . BREAST BIOPSY Left 05/10/2017   2 areas biopsied under [place]. Path pending  . THYROIDECTOMY      FAMILY HISTORY: Reviewed and unchanged. No reported history of malignancy or chronic disease.     ADVANCED DIRECTIVES:    HEALTH MAINTENANCE: Social History   Tobacco Use  . Smoking status: Not on file  Substance Use Topics  . Alcohol use: Not on file  . Drug use: Not on file     Colonoscopy:  PAP:  Bone density:  Lipid panel:  Allergies  Allergen Reactions  . Penicillins Rash and Other (See Comments)    Patient reports receiving IM penicillin at 8 months old, and subsequently becoming unconscious. Unknown rxn. From childhood   . Iron Other (See Comments)    With IV Infusions     Current Outpatient Medications  Medication Sig Dispense Refill  . acetaminophen (TYLENOL) 325 MG tablet Take 650 mg by mouth every 4 (four) hours as needed.    Marland Kitchen levothyroxine (SYNTHROID, LEVOTHROID) 112 MCG tablet Take 112 mcg by mouth daily before breakfast.     No current facility-administered medications for this visit.     OBJECTIVE: There were no vitals filed for this visit.   There is no height or weight on file to calculate BMI.    ECOG FS:0 - Asymptomatic  General: Well-developed, well-nourished, no acute distress. Eyes: Pink conjunctiva, anicteric sclera. HEENT: Normocephalic, moist mucous membranes, clear oropharnyx. Lungs: Clear to auscultation bilaterally. Heart: Regular rate and rhythm. No rubs, murmurs, or gallops. Abdomen: Soft, nontender, nondistended. No organomegaly noted, normoactive  bowel sounds. Musculoskeletal: No edema, cyanosis, or clubbing. Neuro: Alert, answering all questions appropriately. Cranial nerves grossly intact. Skin: No rashes or petechiae noted. Psych: Normal affect.  LAB RESULTS:  No results found for: NA, K, CL, CO2, GLUCOSE, BUN, CREATININE,  CALCIUM, PROT, ALBUMIN, AST, ALT, ALKPHOS, BILITOT, GFRNONAA, GFRAA  Lab Results  Component Value Date   WBC 7.6 06/21/2018   NEUTROABS 4.6 06/21/2018   HGB 12.8 06/21/2018   HCT 39.6 06/21/2018   MCV 88.8 06/21/2018   PLT 343 06/21/2018   Lab Results  Component Value Date   IRON 123 06/21/2018   TIBC 391 06/21/2018   IRONPCTSAT 31 06/21/2018   Lab Results  Component Value Date   FERRITIN 12 06/21/2018     STUDIES: No results found.  ASSESSMENT: Iron deficiency anemia, history of thyroid cancer.  PLAN:    1. Iron deficiency anemia: Patient's hemoglobin and iron stores continue to be within normal limits. Previously, the remainder of her laboratory work was either negative or within normal limits.  She does not require additional treatment today.  Last received 510 mg IV Feraheme on September 15, 2017.  Continue oral iron supplementation.  Return to clinic in 4 months with repeat laboratory work and further evaluation.   2. History of thyroid cancer: Patient underwent thyroidectomy at [facility], but does not report having received radioactive iodide. Antithyroglobulin antibodies from November 30, 2017 are undetectable. Ultrasound of soft tissue neck on April 20, 2016 did not reveal any evidence of recurrence.  Continue current dose of Synthroid as prescribed by primary care.   3.  Weakness and fatigue: Unrelated to iron deficiency.   I spent a total of 20 minutes face-to-face with the patient of which greater than 50% of the visit was spent in counseling and coordination of care as detailed above.   The entire visit was done in the presence of an interpreter.  Patient expressed understanding and was in agreement with this plan. She also understands that She can call clinic at any time with any questions, concerns, or complaints.   Lloyd Huger, MD   03/18/2019 11:25 AM

## 2019-03-24 ENCOUNTER — Ambulatory Visit: Payer: Self-pay | Admitting: Oncology

## 2019-03-24 ENCOUNTER — Inpatient Hospital Stay: Payer: Self-pay | Admitting: Oncology

## 2019-03-24 ENCOUNTER — Ambulatory Visit: Payer: Self-pay

## 2019-03-24 ENCOUNTER — Other Ambulatory Visit: Payer: Self-pay

## 2019-03-24 ENCOUNTER — Inpatient Hospital Stay: Payer: Self-pay

## 2019-03-24 ENCOUNTER — Inpatient Hospital Stay: Payer: Self-pay | Attending: Oncology

## 2019-07-18 ENCOUNTER — Encounter: Payer: Self-pay | Admitting: Pharmacy Technician

## 2019-07-18 NOTE — Progress Notes (Signed)
Patient no longer getting Feraheme from [facility] based on no longer getting treatment.. Coverage dates 12/02/17-12/03/18 Last DOS covered is 09/15/17.

## 2019-10-02 ENCOUNTER — Ambulatory Visit: Payer: Self-pay | Attending: Internal Medicine

## 2019-10-02 DIAGNOSIS — Z23 Encounter for immunization: Secondary | ICD-10-CM | POA: Insufficient documentation

## 2019-10-02 NOTE — Progress Notes (Signed)
   Covid-19 Vaccination Clinic  Name:  Teresa Atkins    MRN: [id number] DOB: 1971-07-26  10/02/2019  Ms. Teresa Atkins was observed post Covid-19 immunization for 15 minutes without incidence. She was provided with Vaccine Information Sheet and instruction to access the V-Safe system.   Ms. Teresa Atkins was instructed to call 911 with any severe reactions post vaccine: Marland Kitchen Difficulty breathing  . Swelling of your face and throat  . A fast heartbeat  . A bad rash all over your body  . Dizziness and weakness    Immunizations Administered    Name Date Dose VIS Date Route   Moderna COVID-19 Vaccine 10/02/2019 10:02 AM 0.5 mL 07/11/2019 Intramuscular   Manufacturer: Moderna   Lot: [id number]   [place]:[id number]

## 2019-10-31 ENCOUNTER — Ambulatory Visit: Payer: Self-pay | Attending: Internal Medicine

## 2019-10-31 DIAGNOSIS — Z23 Encounter for immunization: Secondary | ICD-10-CM

## 2019-10-31 NOTE — Progress Notes (Signed)
   Covid-19 Vaccination Clinic  Name:  Teresa Atkins    MRN: [id number] DOB: 1971/01/01  10/31/2019  Ms. Teresa Atkins was observed post Covid-19 immunization for 15 minutes without incident. She was provided with Vaccine Information Sheet and instruction to access the V-Safe system.   Ms. Teresa Atkins was instructed to call 911 with any severe reactions post vaccine: Marland Kitchen Difficulty breathing  . Swelling of face and throat  . A fast heartbeat  . A bad rash all over body  . Dizziness and weakness   Immunizations Administered    Name Date Dose VIS Date Route   Moderna COVID-19 Vaccine 10/31/2019  2:51 PM 0.5 mL 07/11/2019 Intramuscular   Manufacturer: Levan Hurst   LotUT:[id number]   [place]:[id number]

## 2019-11-28 ENCOUNTER — Encounter: Payer: Self-pay | Admitting: *Deleted

## 2019-11-28 ENCOUNTER — Other Ambulatory Visit: Payer: Self-pay

## 2019-11-28 ENCOUNTER — Emergency Department: Payer: Self-pay

## 2019-11-28 ENCOUNTER — Emergency Department
Admission: EM | Admit: 2019-11-28 | Discharge: 2019-11-28 | Disposition: A | Discharge status: Home / Self Care | Payer: Self-pay | Attending: Emergency Medicine | Admitting: Emergency Medicine

## 2019-11-28 DIAGNOSIS — R0602 Shortness of breath: Secondary | ICD-10-CM | POA: Insufficient documentation

## 2019-11-28 DIAGNOSIS — R079 Chest pain, unspecified: Secondary | ICD-10-CM | POA: Insufficient documentation

## 2019-11-28 DIAGNOSIS — F419 Anxiety disorder, unspecified: Secondary | ICD-10-CM | POA: Insufficient documentation

## 2019-11-28 LAB — BASIC METABOLIC PANEL
Anion gap: 13 (ref 5–15)
BUN: 24 mg/dL — ABNORMAL HIGH (ref 6–20)
CO2: 22 mmol/L (ref 22–32)
Calcium: 9.6 mg/dL (ref 8.9–10.3)
Chloride: 105 mmol/L (ref 98–111)
Creatinine, Ser: 0.84 mg/dL (ref 0.44–1.00)
GFR calc Af Amer: >60 mL/min (ref >60)
GFR calc non Af Amer: >60 mL/min (ref >60)
Glucose, Bld: 111 mg/dL — ABNORMAL HIGH (ref 70–99)
Potassium: 4.3 mmol/L (ref 3.5–5.1)
Sodium: 140 mmol/L (ref 135–145)

## 2019-11-28 LAB — CBC
HCT: 41.8 % (ref 36.0–46.0)
Hemoglobin: 13.5 g/dL (ref 12.0–15.0)
MCH: 28.9 pg (ref 26.0–34.0)
MCHC: 32.3 g/dL (ref 30.0–36.0)
MCV: 89.5 fL (ref 80.0–100.0)
Platelets: 273 10*3/uL (ref 150–400)
RBC: 4.67 MIL/uL (ref 3.87–5.11)
RDW: 13.2 % (ref 11.5–15.5)
WBC: 8.1 10*3/uL (ref 4.0–10.5)
nRBC: 0 % (ref 0.0–0.2)

## 2019-11-28 LAB — TROPONIN I (HIGH SENSITIVITY)
Troponin I (High Sensitivity): 3 ng/L (ref <18)
Troponin I (High Sensitivity): 3 ng/L (ref <18)

## 2019-11-28 MED ORDER — SODIUM CHLORIDE 0.9% FLUSH: 3.0000 mL | Freq: Once | INTRAVENOUS | Status: DC

## 2019-11-28 MED ORDER — DIAZEPAM 5 MG PO TABS: 5.0000 mg | ORAL_TABLET | Freq: Two times a day (BID) | ORAL | 0 refills | Status: AC | PRN

## 2019-11-28 MED ORDER — DIAZEPAM 5 MG PO TABS
5.0000 mg | ORAL_TABLET | Freq: Once | ORAL | Status: AC
  Administered 2019-11-28: 09:00:00 5 mg via ORAL
  Filled 2019-11-28: qty 1

## 2019-11-28 MED ORDER — FAMOTIDINE 20 MG PO TABS: 20.0000 mg | ORAL_TABLET | Freq: Two times a day (BID) | ORAL | 1 refills | Status: AC

## 2019-11-28 NOTE — ED Provider Notes (Signed)
[facility] Emergency Department Provider Note       Time seen: ----------------------------------------- 8:40 AM on 11/28/2019 -----------------------------------------   I have reviewed the triage vital signs and the nursing notes.  HISTORY   Chief Complaint Chest Pain    HPI Teresa Atkins is a 49 y.o. female with a history of anemia, hyperlipidemia, thyroid disease who presents to the ED for chest pain for the last hour.  Patient reports chest pain for the last several months that is worsened over the past several days.  She does admit to being under significant stress.  She has never seen a doctor for this before, denies fevers, chills or other complaints.  Past Medical History:  Diagnosis Date  . Anemia   . Hyperlipidemia   . Thyroid disease     Patient Active Problem List   Diagnosis Date Noted  . Iron deficiency anemia 04/06/2016    Past Surgical History:  Procedure Laterality Date  . BREAST BIOPSY Left 05/10/2017   2 areas biopsied under [place]. Path pending  . THYROIDECTOMY      Allergies Penicillins and Iron  Social History Social History   Tobacco Use  . Smoking status: Never Smoker  . Smokeless tobacco: Never Used  Substance Use Topics  . Alcohol use: Not on file  . Drug use: Not on file    Review of Systems Constitutional: Negative for fever. Cardiovascular: Positive for chest pain Respiratory: Positive for shortness of breath Gastrointestinal: Negative for abdominal pain, vomiting and diarrhea. Musculoskeletal: Negative for back pain. Skin: Negative for rash. Neurological: Negative for headaches, focal weakness or numbness.  All systems negative/normal/unremarkable except as stated in the HPI  ____________________________________________   PHYSICAL EXAM:  VITAL SIGNS: ED Triage Vitals  Enc Vitals Group     BP 11/28/19 0145 (!) 169/114     Pulse Rate 11/28/19 0145 81     Resp 11/28/19 0145 20   Temp 11/28/19 0145 97.9 F (36.6 C)     Temp Source 11/28/19 0145 Oral     SpO2 11/28/19 0145 99 %     Weight 11/28/19 0146 163 lb (73.9 kg)     Height 11/28/19 0146 5\' 5"  (1.651 m)     Head Circumference --      Peak Flow --      Pain Score 11/28/19 0145 10     Pain Loc --      Pain Edu? --      Excl. in [place]? --     Constitutional: Alert and oriented.  Anxious, no distress Eyes: Conjunctivae are normal. Normal extraocular movements. Cardiovascular: Normal rate, regular rhythm. No murmurs, rubs, or gallops. Respiratory: Normal respiratory effort without tachypnea nor retractions. Breath sounds are clear and equal bilaterally. No wheezes/rales/rhonchi. Gastrointestinal: Soft and nontender. Normal bowel sounds Musculoskeletal: Nontender with normal range of motion in extremities. No lower extremity tenderness nor edema. Neurologic:  Normal speech and language. No gross focal neurologic deficits are appreciated.  Skin:  Skin is warm, dry and intact. No rash noted. Psychiatric: Mood and affect are normal. Speech and behavior are normal.  ____________________________________________  EKG: Interpreted by me.  Sinus rhythm with rate of 74 bpm, normal PR interval, normal QRS, normal QT  ____________________________________________  ED COURSE:  As part of my medical decision making, I reviewed the following data within the [place] History obtained from family if available, nursing notes, old chart and ekg, as well as notes from prior ED visits. Patient presented  for chest pain, we will assess with labs and imaging as indicated at this time.   Procedures  Teresa Atkins was evaluated in Emergency Department on 11/28/2019 for the symptoms described in the history of present illness. She was evaluated in the context of the global COVID-19 pandemic, which necessitated consideration that the patient might be at risk for infection with the SARS-CoV-2 virus that causes  COVID-19. Institutional protocols and algorithms that pertain to the evaluation of patients at risk for COVID-19 are in a state of rapid change based on information released by regulatory bodies including the CDC and federal and state organizations. These policies and algorithms were followed during the patient's care in the ED.  ____________________________________________   LABS (pertinent positives/negatives)  Labs Reviewed  BASIC METABOLIC PANEL - Abnormal; Notable for the following components:      Result Value   Glucose, Bld 111 (*)    BUN 24 (*)    All other components within normal limits  CBC  TROPONIN I (HIGH SENSITIVITY)  TROPONIN I (HIGH SENSITIVITY)    RADIOLOGY Images were viewed by me  Chest x-ray was unremarkable  ____________________________________________   DIFFERENTIAL DIAGNOSIS   Musculoskeletal pain, GERD, anxiety, panic attack, MI, PE  FINAL ASSESSMENT AND PLAN  Chest pain, anxiety   Plan: The patient had presented for chest pain likely secondary to anxiety. Patient's labs were unremarkable. Patient's imaging did not reveal any acute process and she has not had any respiratory symptoms.  We will trial Valium and Pepcid, otherwise she is cleared for outpatient follow-up this week with her doctor.   Laurence Aly, MD    Note: This note was generated in part or whole with voice recognition software. Voice recognition is usually quite accurate but there are transcription errors that can and very often do occur. I apologize for any typographical errors that were not detected and corrected.     Earleen Newport, MD 11/28/19 [phone]

## 2019-11-28 NOTE — ED Triage Notes (Signed)
Pt reports chest pain for 1 hour.  Pt states sob.  Nonsmoker.  No cough.  No n/v  Pt alert.  Pt anxious in triage.

## 2019-12-27 ENCOUNTER — Ambulatory Visit: Payer: Self-pay | Attending: Oncology

## 2020-04-29 ENCOUNTER — Ambulatory Visit: Payer: Self-pay | Attending: Internal Medicine

## 2020-04-29 DIAGNOSIS — Z23 Encounter for immunization: Secondary | ICD-10-CM

## 2020-04-29 NOTE — Progress Notes (Signed)
   Covid-19 Vaccination Clinic  Name:  Teresa Atkins    MRN: [id number] DOB: 06-03-1971  04/29/2020  Ms. Teresa Atkins was observed post Covid-19 immunization for 15 minutes without incident. She was provided with Vaccine Information Sheet and instruction to access the V-Safe system.   Ms. Teresa Atkins was instructed to call 911 with any severe reactions post vaccine: Marland Kitchen Difficulty breathing  . Swelling of face and throat  . A fast heartbeat  . A bad rash all over body  . Dizziness and weakness

## 2020-10-10 ENCOUNTER — Other Ambulatory Visit: Payer: Self-pay | Admitting: Internal Medicine

## 2020-10-10 DIAGNOSIS — E890 Postprocedural hypothyroidism: Secondary | ICD-10-CM

## 2020-10-10 DIAGNOSIS — Z1231 Encounter for screening mammogram for malignant neoplasm of breast: Secondary | ICD-10-CM

## 2020-10-10 DIAGNOSIS — R413 Other amnesia: Secondary | ICD-10-CM

## 2021-01-17 ENCOUNTER — Other Ambulatory Visit: Payer: Self-pay | Admitting: Neurology

## 2021-01-17 DIAGNOSIS — E569 Vitamin deficiency, unspecified: Secondary | ICD-10-CM

## 2022-01-02 ENCOUNTER — Other Ambulatory Visit: Payer: Self-pay | Admitting: Internal Medicine

## 2022-01-02 DIAGNOSIS — Z1231 Encounter for screening mammogram for malignant neoplasm of breast: Secondary | ICD-10-CM

## 2022-02-26 ENCOUNTER — Other Ambulatory Visit: Payer: Self-pay

## 2022-02-26 DIAGNOSIS — Z1231 Encounter for screening mammogram for malignant neoplasm of breast: Secondary | ICD-10-CM

## 2022-03-03 ENCOUNTER — Ambulatory Visit: Payer: Self-pay | Attending: Hematology and Oncology

## 2022-03-09 ENCOUNTER — Telehealth: Payer: Self-pay | Admitting: *Deleted

## 2022-07-06 ENCOUNTER — Telehealth: Payer: Self-pay | Admitting: *Deleted

## 2022-12-24 ENCOUNTER — Other Ambulatory Visit: Payer: Self-pay | Admitting: Internal Medicine

## 2022-12-24 DIAGNOSIS — Z1231 Encounter for screening mammogram for malignant neoplasm of breast: Secondary | ICD-10-CM

## 2022-12-25 ENCOUNTER — Other Ambulatory Visit: Payer: Self-pay

## 2022-12-25 DIAGNOSIS — Z1231 Encounter for screening mammogram for malignant neoplasm of breast: Secondary | ICD-10-CM

## 2023-02-15 ENCOUNTER — Ambulatory Visit
Admission: RE | Admit: 2023-02-15 | Discharge: 2023-02-15 | Disposition: A | Discharge status: Home / Self Care | Payer: Self-pay | Source: Ambulatory Visit | Attending: Obstetrics and Gynecology | Admitting: Obstetrics and Gynecology

## 2023-02-15 ENCOUNTER — Ambulatory Visit: Payer: Self-pay | Attending: Hematology and Oncology | Admitting: Hematology and Oncology

## 2023-02-15 VITALS — BP 125/93 | Wt 172.9 lb

## 2023-02-15 DIAGNOSIS — Z1231 Encounter for screening mammogram for malignant neoplasm of breast: Secondary | ICD-10-CM

## 2023-02-15 NOTE — Progress Notes (Addendum)
Teresa Atkins is a 52 y.o. female who presents to [facility] clinic today with no complaints.    Pap Smear: Pap not smear completed today. Last Pap smear was 11/23/2019 at [facility] clinic and was normal. Per patient has no history of an abnormal Pap smear. Last Pap smear result is available in Epic.   Physical exam: Breasts Breasts symmetrical. No skin abnormalities bilateral breasts. No nipple retraction bilateral breasts. No nipple discharge bilateral breasts. No lymphadenopathy. No lumps palpated bilateral breasts. Has slight tenderness and tissue thickness on right breast when palpated.   Last Mammogram at [facility] on 04/29/2017 showed left breast thickening at 12 o'clock, negative. Breast Biopsy completed at [facility] on 05/10/2017 showed left breast 12 and 1130 o'clock -benign nodular adenosis, and negative for atypia and malignancy.    Pelvic/Bimanual Pap is not indicated today    Smoking History: Patient has never smoked. Not referred to quit line.    Patient Navigation: Patient education provided. Access to services provided for patient through B[facility]CP program. Teresa Atkins interpreter provided. No transportation provided   Colorectal Cancer Screening: Per patient has never had colonoscopy completed. Will mail patient a Colorectal FIT test to complete and mail.  No complaints today.    Breast and Cervical Cancer Risk Assessment: Patient does not have family history of breast cancer, known genetic mutations, or radiation treatment to the chest before age 89. Patient does not have history of cervical dysplasia, immunocompromised, or DES exposure in-utero.  Risk Scores as of 02/15/2023     Teresa Atkins           5-year 1.26 %   Lifetime 10.28 %   This patient is [place] but has no documented birth country, so the [place] model used data from [place] patients to calculate their risk score. Document a birth country in the Demographics activity for a more accurate score.         Last  calculated by Teresa Rutherford, LPN on 08/15/1094 at 10:43 AM        A: B[facility]CP exam without pap smear No complaints or concerns with breast.   P: Referred patient to the [facility] for a screening mammogram. Appointment scheduled [facility] 02/15/2023 at 1140 am.  Teresa Catching, RN 02/15/2023 10:56 AM

## 2023-02-15 NOTE — Patient Instructions (Signed)
Taught Teresa Atkins about self breast awareness and gave educational materials to take home. Patient did not need a Pap smear today due to last Pap smear was in 11/23/2019 per patient. Let her know B[facility]CP will cover Pap smears every 5 years unless has a history of abnormal Pap smears. Referred patient to the Breast Center for diagnostic mammogram. Appointment scheduled for 02/15/23. Patient aware of appointment and will be there. Let patient know will follow up with her within the next couple weeks with results. Teresa Atkins verbalized understanding.  Pascal Lux, NP 11:14 AM
